# Patient Record
Sex: Female | Born: 1960 | Race: White | Hispanic: No | Smoking: Former smoker
Health system: Southern US, Community
[De-identification: ages and names within clinical notes are randomized; demographics above are authoritative.]

## PROBLEM LIST (undated history)

## (undated) DIAGNOSIS — K56609 Unspecified intestinal obstruction, unspecified as to partial versus complete obstruction: Secondary | ICD-10-CM

## (undated) DIAGNOSIS — E785 Hyperlipidemia, unspecified: Secondary | ICD-10-CM

## (undated) DIAGNOSIS — I10 Essential (primary) hypertension: Secondary | ICD-10-CM

## (undated) DIAGNOSIS — F329 Major depressive disorder, single episode, unspecified: Secondary | ICD-10-CM

## (undated) DIAGNOSIS — F32A Depression, unspecified: Secondary | ICD-10-CM

## (undated) HISTORY — DX: Depression, unspecified: F32.A

## (undated) HISTORY — PX: HERNIA REPAIR: SHX51

## (undated) HISTORY — DX: Essential (primary) hypertension: I10

## (undated) HISTORY — PX: OVARIAN CYST REMOVAL: SHX89

## (undated) HISTORY — DX: Hyperlipidemia, unspecified: E78.5

## (undated) HISTORY — DX: Major depressive disorder, single episode, unspecified: F32.9

## (undated) HISTORY — PX: OTHER SURGICAL HISTORY: SHX169

---

## 2007-11-24 ENCOUNTER — Observation Stay (HOSPITAL_COMMUNITY): Admission: EM | Admit: 2007-11-24 | Discharge: 2007-11-25 | Payer: Self-pay | Admitting: Emergency Medicine

## 2007-12-09 ENCOUNTER — Encounter: Payer: Self-pay | Admitting: Cardiology

## 2007-12-13 ENCOUNTER — Ambulatory Visit (HOSPITAL_COMMUNITY): Admission: RE | Admit: 2007-12-13 | Discharge: 2007-12-13 | Payer: Self-pay | Admitting: Cardiology

## 2008-05-02 ENCOUNTER — Ambulatory Visit (HOSPITAL_COMMUNITY): Admission: RE | Admit: 2008-05-02 | Discharge: 2008-05-02 | Payer: Self-pay | Admitting: General Surgery

## 2008-05-04 ENCOUNTER — Encounter: Admission: RE | Admit: 2008-05-04 | Discharge: 2008-05-04 | Payer: Self-pay | Admitting: General Surgery

## 2008-11-02 ENCOUNTER — Encounter: Payer: Self-pay | Admitting: Obstetrics and Gynecology

## 2008-11-02 ENCOUNTER — Other Ambulatory Visit: Admission: RE | Admit: 2008-11-02 | Discharge: 2008-11-02 | Payer: Self-pay | Admitting: Obstetrics and Gynecology

## 2008-11-02 ENCOUNTER — Ambulatory Visit: Payer: Self-pay | Admitting: Obstetrics and Gynecology

## 2009-03-16 ENCOUNTER — Observation Stay (HOSPITAL_COMMUNITY): Admission: EM | Admit: 2009-03-16 | Discharge: 2009-03-17 | Payer: Self-pay | Admitting: Emergency Medicine

## 2009-05-02 ENCOUNTER — Ambulatory Visit (HOSPITAL_COMMUNITY): Admission: RE | Admit: 2009-05-02 | Discharge: 2009-05-02 | Payer: Self-pay | Admitting: General Surgery

## 2009-05-09 ENCOUNTER — Ambulatory Visit (HOSPITAL_COMMUNITY): Admission: RE | Admit: 2009-05-09 | Discharge: 2009-05-09 | Payer: Self-pay | Admitting: General Surgery

## 2009-06-20 ENCOUNTER — Encounter: Admission: RE | Admit: 2009-06-20 | Discharge: 2009-09-18 | Payer: Self-pay | Admitting: General Surgery

## 2009-07-02 ENCOUNTER — Encounter (INDEPENDENT_AMBULATORY_CARE_PROVIDER_SITE_OTHER): Payer: Self-pay | Admitting: General Surgery

## 2009-07-02 ENCOUNTER — Inpatient Hospital Stay (HOSPITAL_COMMUNITY): Admission: RE | Admit: 2009-07-02 | Discharge: 2009-07-04 | Payer: Self-pay | Admitting: General Surgery

## 2009-07-03 ENCOUNTER — Encounter (INDEPENDENT_AMBULATORY_CARE_PROVIDER_SITE_OTHER): Payer: Self-pay | Admitting: General Surgery

## 2009-07-03 ENCOUNTER — Ambulatory Visit: Payer: Self-pay | Admitting: Vascular Surgery

## 2010-04-15 LAB — CBC
HCT: 34.6 % — ABNORMAL LOW (ref 36.0–46.0)
HCT: 36.2 % (ref 36.0–46.0)
HCT: 44 % (ref 36.0–46.0)
Hemoglobin: 11.8 g/dL — ABNORMAL LOW (ref 12.0–15.0)
Hemoglobin: 14.9 g/dL (ref 12.0–15.0)
MCHC: 33.9 g/dL (ref 30.0–36.0)
MCHC: 34.2 g/dL (ref 30.0–36.0)
MCV: 90.4 fL (ref 78.0–100.0)
MCV: 91 fL (ref 78.0–100.0)
Platelets: 222 10*3/uL (ref 150–400)
Platelets: 278 10*3/uL (ref 150–400)
RBC: 4.91 MIL/uL (ref 3.87–5.11)
RDW: 13.4 % (ref 11.5–15.5)
RDW: 13.9 % (ref 11.5–15.5)
WBC: 9.1 10*3/uL (ref 4.0–10.5)

## 2010-04-15 LAB — GLUCOSE, CAPILLARY
Glucose-Capillary: 113 mg/dL — ABNORMAL HIGH (ref 70–99)
Glucose-Capillary: 118 mg/dL — ABNORMAL HIGH (ref 70–99)
Glucose-Capillary: 124 mg/dL — ABNORMAL HIGH (ref 70–99)
Glucose-Capillary: 125 mg/dL — ABNORMAL HIGH (ref 70–99)
Glucose-Capillary: 129 mg/dL — ABNORMAL HIGH (ref 70–99)
Glucose-Capillary: 260 mg/dL — ABNORMAL HIGH (ref 70–99)
Glucose-Capillary: 97 mg/dL (ref 70–99)

## 2010-04-15 LAB — COMPREHENSIVE METABOLIC PANEL
Albumin: 4.3 g/dL (ref 3.5–5.2)
Alkaline Phosphatase: 74 U/L (ref 39–117)
BUN: 20 mg/dL (ref 6–23)
CO2: 28 mEq/L (ref 19–32)
Chloride: 101 mEq/L (ref 96–112)
Glucose, Bld: 117 mg/dL — ABNORMAL HIGH (ref 70–99)
Potassium: 5 mEq/L (ref 3.5–5.1)
Total Bilirubin: 0.7 mg/dL (ref 0.3–1.2)

## 2010-04-15 LAB — DIFFERENTIAL
Basophils Absolute: 0 10*3/uL (ref 0.0–0.1)
Basophils Relative: 0 % (ref 0–1)
Eosinophils Absolute: 0 10*3/uL (ref 0.0–0.7)
Eosinophils Relative: 0 % (ref 0–5)
Lymphocytes Relative: 8 % — ABNORMAL LOW (ref 12–46)
Lymphocytes Relative: 9 % — ABNORMAL LOW (ref 12–46)
Lymphs Abs: 0.8 10*3/uL (ref 0.7–4.0)
Monocytes Absolute: 0.5 10*3/uL (ref 0.1–1.0)
Monocytes Relative: 6 % (ref 3–12)
Monocytes Relative: 6 % (ref 3–12)
Neutro Abs: 7.6 10*3/uL (ref 1.7–7.7)
Neutro Abs: 7.6 10*3/uL (ref 1.7–7.7)
Neutro Abs: 7 10*3/uL (ref 1.7–7.7)
Neutrophils Relative %: 86 % — ABNORMAL HIGH (ref 43–77)

## 2010-04-15 LAB — HEMOGLOBIN AND HEMATOCRIT, BLOOD: HCT: 33.5 % — ABNORMAL LOW (ref 36.0–46.0)

## 2010-04-17 LAB — BASIC METABOLIC PANEL
BUN: 17 mg/dL (ref 6–23)
Calcium: 9 mg/dL (ref 8.4–10.5)
GFR calc non Af Amer: >60 mL/min (ref >60)
Glucose, Bld: 307 mg/dL — ABNORMAL HIGH (ref 70–99)
Sodium: 135 mEq/L (ref 135–145)

## 2010-04-17 LAB — LIPID PANEL
Cholesterol: 234 mg/dL — ABNORMAL HIGH (ref 0–200)
HDL: 38 mg/dL — ABNORMAL LOW (ref >39)
Triglycerides: 294 mg/dL — ABNORMAL HIGH (ref <150)

## 2010-04-17 LAB — DIFFERENTIAL
Basophils Absolute: 0 10*3/uL (ref 0.0–0.1)
Lymphocytes Relative: 13 % (ref 12–46)
Neutro Abs: 6 10*3/uL (ref 1.7–7.7)
Neutrophils Relative %: 80 % — ABNORMAL HIGH (ref 43–77)

## 2010-04-17 LAB — GLUCOSE, CAPILLARY: Glucose-Capillary: 119 mg/dL — ABNORMAL HIGH (ref 70–99)

## 2010-04-17 LAB — CK TOTAL AND CKMB (NOT AT ARMC): Relative Index: 2.2 (ref 0.0–2.5)

## 2010-04-17 LAB — POCT CARDIAC MARKERS
CKMB, poc: 1.8 ng/mL (ref 1.0–8.0)
Troponin i, poc: <0.05 ng/mL (ref 0.00–0.09)

## 2010-04-17 LAB — TROPONIN I
Troponin I: 0.01 ng/mL (ref 0.00–0.06)
Troponin I: 0.01 ng/mL (ref 0.00–0.06)

## 2010-04-17 LAB — CBC
Platelets: 241 10*3/uL (ref 150–400)
RDW: 13.6 % (ref 11.5–15.5)

## 2010-06-11 NOTE — Discharge Summary (Signed)
NAMEJONNA, Ashlee Thomas             ACCOUNT NO.:  [id number]   MEDICAL RECORD NO.:  [id number]          PATIENT TYPE:  INP   LOCATION:  3710                         FACILITY:  [facility]   PHYSICIAN:  Peggye Pitt, M.D. DATE OF BIRTH:  02-19-1960   DATE OF ADMISSION:  11/24/2007  DATE OF DISCHARGE:  11/25/2007                               DISCHARGE SUMMARY   DISCHARGE DIAGNOSES:  1. Chest pain ruled out for acute myocardial infarction.  2. Hypertension.  3. Type 2 diabetes.  4. Hyperlipidemia.  5. Obesity.  6. Depression.   DISCHARGE MEDICATIONS:  1. Aspirin 81 mg p.o. daily.  2. Byetta 10 mcg subcu b.i.d.  3. Prozac 40 mg daily.  4. Glyburide 10 mg b.i.d.  5. Hydrochlorothiazide 25 mg daily.  6. Lisinopril 20 mg daily.  7. Metformin 1000 mg p.o. b.i.d.   DISPOSITION AND FOLLOWUP:  The patient is discharged home in stable  condition.  Her chest pain has now resolved.  She is to follow up with  Dr. Patty Sermons with [facility] on December 01, 2007 at 2:00  p.m. at which time she should be evaluated for a possible stress test.   CONSULTATIONS THIS HOSPITALIZATION:  None.   IMAGES PERFORMED THIS HOSPITALIZATION:  Chest x-ray on November 24, 2007  consistent with no acute disease.  She had an EKG that showed normal  sinus rhythm at a rate of 80 with left axis deviation and no acute ST-T  wave changes.   HISTORY AND PHYSICAL EXAM:  For full details, please refer to history  and physical dictated by Dr. Flonnie Overman on November 24, 2007, but in brief,  Ashlee Thomas is a 50 year old Caucasian woman who [facility] with chest pain that lasted about 20 minutes that  was located over a left precordial area, pressure like and was not  associated with any other symptoms.  There was no shortness of breath,  movements, fevers, or cough.  She was given sublingual nitro which  completely resolved her chest pain.  For this reason, we are called for   admission.   HOSPITAL COURSE:  1. Chest pain.  The patient ruled out for acute myocardial infarction      with three sets of negative cardiac enzymes as well as an EKG that      showed no acute changes..  She did not have any arrhythmias on      telemetry.  Given the fact that she does have some risk factors for      coronary artery disease,  I have set her up to see Dr. Patty Sermons as      an outpatient on December 01, 2007 for consultation.  I believe at      that time some serous consideration should be given to performing      some form of a stress test.  2. For her hypertension, she has maintained good blood pressure in the      hospital.  We will continue her home medications.  3. For her type 2 diabetes with good CBG control, continue her home  medications.  4. For her hyperlipidemia, she had a good fasting lipid panel.   VITAL SIGNS UPON DISCHARGE:  Blood pressure 107/73, heart rate 81,  respirations 19, O2 sats 99% on room air with a temperature of 98.6.   Total cholesterol of 157, triglycerides of 207, HDL of 32 and LDL of 84,  hemoglobin A1c of 8.8, a TSH of 1.116 and negative cardiac enzymes x3.      Peggye Pitt, M.D.  Electronically Signed     EH/MEDQ  D:  11/25/2007  T:  11/26/2007  Job:  [id number]   cc:   Ashlee Thomas, M.D.  Ashlee Thomas, M.D.

## 2010-06-11 NOTE — H&P (Signed)
Ashlee Thomas, Ashlee Thomas             ACCOUNT NO.:  [id number]   MEDICAL RECORD NO.:  [id number]          PATIENT TYPE:  INP   LOCATION:  1827                         FACILITY:  [facility]   PHYSICIAN:  Lucita Ferrara, MD         DATE OF BIRTH:  May 10, 1960   DATE OF ADMISSION:  11/24/2007  DATE OF DISCHARGE:                              HISTORY & PHYSICAL   PRIMARY CARE DOCTORS:  Dr. Herb Grays.   CHIEF COMPLAINT:  The patient is a 50 year old Caucasian female who  presents to the [facility] with chest pain which started  earlier in the day, lasted for about 20 minutes, located left  ventricular precordial area, pressure-like in intensity, not associated  with food, movement, fevers, cough,.  The patient was given sublingual  nitroglycerin via EMS which resolved the chest pain.  The patient denies  abdominal pain, nausea, vomiting.  There is no pleuritis, cough, fevers,  chills.  Risk factors include:  Hypertension, diabetes,  hypercholesteremia.  There is no family history of premature coronary  artery disease.   PAST MEDICAL HISTORY:  Significant for:  1. Hypertension.  2. Diabetes.  3. Depression.  4. Hypercholesteremia.   ALLERGIES:  No known drug allergies.   SOCIAL HISTORY:  She denies drugs, alcohol or tobacco.   SURGERIES:  No past surgeries.   MEDICATIONS NOT VERIFIED YET:  Include Byetta, glyburide, metformin,  Prozac, Vytorin.   REVIEW OF SYSTEMS:  As per HPI, otherwise negative.   PHYSICAL EXAMINATION:  Generally speaking, the patient is in no acute  distress.  Blood pressure is 120/90.  Pulse 84.  Respiration is 20.  Temperature  98.7.  HEENT:  Normocephalic, atraumatic.  Sclerae is anicteric.  NECK:  Supple.  No JVD, no carotid bruits.  PERRLA, extraocular muscles  intact.  CARDIOVASCULAR:  S1, S2, regular rate and rhythm.  No murmurs, rubs,  clicks.  LUNGS:  Clear to auscultation bilaterally.  No rhonchi, rales or  wheezes.  ABDOMEN:  Obese, soft,  nontender, nondistended.  Positive bowel sounds.  EXTREMITIES:  No clubbing, cyanosis or edema.  NEUROLOGIC:  The patient is alert and oriented x3.  Cranial nerves II  through XII grossly intact.   LABORATORY DATA:  Cardiac enzymes 3 sets, negative for troponins.  Basic  metabolic panel normal with exception of elevated glucose of 225.  CBC  within normal limits.   ASSESSMENT/PLAN:  Patient is a 50 year old with chest pain and moderate  risk factors including what seems to be uncontrolled diabetes,  controlled hypertension, there is no premature coronary artery disease.  Patient is a nonsmoker.  She has never had chest pain such as this  before.  She never had stress test or coronary evaluation before.  Her  EKG shows left anterior fascicular block, left atrial hypertrophy, left  axis deviation and there is no known prior EKGs.   DISCUSSION/PLAN:  We will go ahead and admit the patient for chest pain,  rule out ACS protocol.  Cardiac enzymes x3 every 8 hours.  EKG shows  nonspecific changes.  No acute changes.  I will  repeat her EKG in the  morning.  Will get hemoglobin A1c and monitor her diabetic control.  We  will continue but will hold her oral medications and start her on a  sliding scale insulin.  We will check a fasting lipid profile in the  morning and TSH in the morning.  We will proceed with a stress test in  the morning if her enzymes are negative.  This obviously could be  arranged as outpatient or inpatient depending on her risk  stratification.  The rest of plans depend on her progress, consult and  recommendations.      Lucita Ferrara, MD  Electronically Signed     RR/MEDQ  D:  11/24/2007  T:  11/24/2007  Job:  [id number]

## 2010-06-11 NOTE — H&P (Signed)
[name], WEDDINGTON NO.:  [id number]   MEDICAL RECORD NO.:  [id number]           PATIENT TYPE:   LOCATION:                                 FACILITY:   PHYSICIAN:  Peter M. Swaziland, M.D.  DATE OF BIRTH:  08-May-1960   DATE OF ADMISSION:  12/13/2007  DATE OF DISCHARGE:                              HISTORY & PHYSICAL   HISTORY OF PRESENT ILLNESS:  Mr. Ashlee Thomas is a pleasant 50 year old white  female.  She was hospitalized at the end of October at [facility] after experiencing episode of pallor and chest pain with  radiation down her left arm.  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  She  states that day she felt unwell and a vague sense, and then became  worse.  She had no nausea or vomiting.  She was admitted to the  hospitalist service and ruled out for myocardial infarction.  Subsequently, referred for outpatient stress Cardiolite study.  On  December 09, 2007, she underwent adenosine Cardiolite study, and this  demonstrated evidence of moderate anterior wall ischemia.  Ejection  fraction was normal at 55%.  Based on these findings, it was recommended  she undergo cardiac catheterization.  The patient does have multiple  cardiac risk factors including history of diabetes, hypertension,  obesity, and hypercholesterolemia.   PAST MEDICAL HISTORY:  1. Diabetes mellitus, type 2.  2. Hypertension.  3. Hypercholesterolemia.  4. Depression.  5. Previous tonsillectomy.  6. Previous ovarian cyst removal.   She has no known allergies.   Current medications include:  1. Aspirin 81 mg per day.  2. Byetta 10 mcg b.i.d.  3. Prozac 40 mg per day.  4. Glyburide 10 mg b.i.d.  5. Metformin 1000 mg b.i.d.  6. Vytorin 10/80 mg daily.  7. Lisinopril/hydrochlorothiazide 20/25 mg per day.   SOCIAL HISTORY:  The patient is a second grade Engineer, site.  She is  divorced.  She has 5 children, ages  11-25.  She quit smoking 25 years  ago.  She denies alcohol use.   FAMILY HISTORY:  Father is age 50, in good health.  Mother is age 69,  has diabetes.  One brother age 19, is in good health.   REVIEW OF SYSTEMS:  There is no known history of peptic ulcer disease or  cholelithiasis.  She has a large left inguinal hernia, which has been  unrepaired due to her large body mass.  She has considered gastric  banding surgery in the past.  She states she recently fractured her  right foot and is wearing a walking boot.  All other systems were  reviewed in detail, are negative.   PHYSICAL EXAMINATION:  GENERAL:  The patient is an obese white female in  no apparent distress.  VITAL SIGNS:  Weight is 286, blood pressure is 110/80, pulse 96 and  regular, and respirations are normal.  HEENT:  She is normocephalic and atraumatic.  Pupils are equal, round,  and reactive.  Oropharynx is clear.  NECK:  Supple without JVD, adenopathy, thyromegaly, or bruits.  LUNGS:  Clear to auscultation and percussion.  CARDIAC:  Regular rate and rhythm without gallop, murmur, rub, or click.  ABDOMEN:  Obese, soft, nontender and no mass or bruits.  EXTREMITIES:  Without edema or cyanosis.  Pedal pulses are palpable.  She does have a left inguinal hernia in the left lower quadrant.  SKIN:  Warm and dry.  She is wearing a walking boot on the right foot.  NEUROLOGIC:  Alert and oriented x4.  Mood is appropriate.   LABORATORY DATA:  ECG shows normal sinus rhythm with left axis  deviation.  Chest x-ray showed no active disease.  Other chemistries are  pending.   IMPRESSION:  1. Recent onset of chest pain in patient with multiple cardiac risk      factors.  Abnormal adenosine Cardiolite study showing evidence of      anterior wall ischemia.  2. Diabetes mellitus, type 2.  3. Hypertension.  4. Hypercholesterolemia.  5. Morbid obesity.   PLAN:  We will proceed with diagnostic cardiac catheterization with   potential intervention if indicated.  Procedure and risks were described  in detail to the patient.           ______________________________  Peter M. Swaziland, M.D.     PMJ/MEDQ  D:  12/10/2007  T:  12/11/2007  Job:  [id number]   cc:   Ashlee Thomas, M.D.  Ashlee Thomas, M.D.

## 2010-10-29 LAB — GLUCOSE, CAPILLARY
Glucose-Capillary: 128 — ABNORMAL HIGH
Glucose-Capillary: 170 — ABNORMAL HIGH
Glucose-Capillary: 248 — ABNORMAL HIGH
Glucose-Capillary: 385 — ABNORMAL HIGH

## 2010-10-29 LAB — LIPID PANEL
LDL Cholesterol: 84
Total CHOL/HDL Ratio: 4.9
VLDL: 41 — ABNORMAL HIGH

## 2010-10-29 LAB — CK TOTAL AND CKMB (NOT AT ARMC): Total CK: 101

## 2010-10-29 LAB — POCT I-STAT, CHEM 8
Calcium, Ion: 1.18
Creatinine, Ser: 0.7
Glucose, Bld: 225 — ABNORMAL HIGH
Hemoglobin: 13.9
TCO2: 26

## 2010-10-29 LAB — CBC
HCT: 39.9
MCV: 88.8
RBC: 4.49
WBC: 7.3

## 2010-10-29 LAB — DIFFERENTIAL
Eosinophils Absolute: 0.1
Eosinophils Relative: 2
Lymphs Abs: 0.9
Monocytes Absolute: 0.3
Monocytes Relative: 5

## 2010-10-29 LAB — POCT CARDIAC MARKERS
Myoglobin, poc: 67.9
Troponin i, poc: <0.05
Troponin i, poc: <0.05
Troponin i, poc: <0.05

## 2010-10-29 LAB — CARDIAC PANEL(CRET KIN+CKTOT+MB+TROPI)
Total CK: 81
Total CK: 93

## 2010-10-29 LAB — HEMOGLOBIN A1C: Mean Plasma Glucose: 206

## 2010-12-03 ENCOUNTER — Telehealth (INDEPENDENT_AMBULATORY_CARE_PROVIDER_SITE_OTHER): Payer: Self-pay | Admitting: General Surgery

## 2010-12-03 NOTE — Telephone Encounter (Signed)
12/03/10 mailed recall notice to patient for bariatric surgery follow-up. Adv pt to call [facility] to schedule an appt. cef °

## 2011-05-08 ENCOUNTER — Encounter: Payer: Self-pay | Admitting: *Deleted

## 2011-05-08 DIAGNOSIS — F329 Major depressive disorder, single episode, unspecified: Secondary | ICD-10-CM | POA: Insufficient documentation

## 2011-05-08 DIAGNOSIS — F32A Depression, unspecified: Secondary | ICD-10-CM | POA: Insufficient documentation

## 2016-01-31 ENCOUNTER — Encounter (HOSPITAL_COMMUNITY): Payer: Self-pay

## 2018-08-03 ENCOUNTER — Emergency Department (HOSPITAL_COMMUNITY): Payer: Managed Care, Other (non HMO)

## 2018-08-03 ENCOUNTER — Inpatient Hospital Stay (HOSPITAL_COMMUNITY)
Admission: EM | Admit: 2018-08-03 | Discharge: 2018-08-06 | DRG: 872 | Disposition: A | Discharge status: Home / Self Care | Payer: Managed Care, Other (non HMO) | Attending: Internal Medicine | Admitting: Internal Medicine

## 2018-08-03 ENCOUNTER — Encounter (HOSPITAL_COMMUNITY): Payer: Self-pay

## 2018-08-03 ENCOUNTER — Other Ambulatory Visit: Payer: Self-pay

## 2018-08-03 DIAGNOSIS — F329 Major depressive disorder, single episode, unspecified: Secondary | ICD-10-CM | POA: Diagnosis present

## 2018-08-03 DIAGNOSIS — A419 Sepsis, unspecified organism: Secondary | ICD-10-CM | POA: Diagnosis present

## 2018-08-03 DIAGNOSIS — Z8719 Personal history of other diseases of the digestive system: Secondary | ICD-10-CM

## 2018-08-03 DIAGNOSIS — Z87891 Personal history of nicotine dependence: Secondary | ICD-10-CM

## 2018-08-03 DIAGNOSIS — K56609 Unspecified intestinal obstruction, unspecified as to partial versus complete obstruction: Secondary | ICD-10-CM | POA: Diagnosis present

## 2018-08-03 DIAGNOSIS — E1165 Type 2 diabetes mellitus with hyperglycemia: Secondary | ICD-10-CM | POA: Diagnosis present

## 2018-08-03 DIAGNOSIS — I10 Essential (primary) hypertension: Secondary | ICD-10-CM | POA: Diagnosis present

## 2018-08-03 DIAGNOSIS — Z9049 Acquired absence of other specified parts of digestive tract: Secondary | ICD-10-CM | POA: Diagnosis not present

## 2018-08-03 DIAGNOSIS — Z79899 Other long term (current) drug therapy: Secondary | ICD-10-CM

## 2018-08-03 DIAGNOSIS — Z7982 Long term (current) use of aspirin: Secondary | ICD-10-CM

## 2018-08-03 DIAGNOSIS — E861 Hypovolemia: Secondary | ICD-10-CM | POA: Diagnosis present

## 2018-08-03 DIAGNOSIS — K56600 Partial intestinal obstruction, unspecified as to cause: Secondary | ICD-10-CM | POA: Diagnosis present

## 2018-08-03 DIAGNOSIS — Z20828 Contact with and (suspected) exposure to other viral communicable diseases: Secondary | ICD-10-CM | POA: Diagnosis present

## 2018-08-03 DIAGNOSIS — R1013 Epigastric pain: Secondary | ICD-10-CM | POA: Diagnosis present

## 2018-08-03 DIAGNOSIS — E785 Hyperlipidemia, unspecified: Secondary | ICD-10-CM | POA: Diagnosis present

## 2018-08-03 DIAGNOSIS — Z7984 Long term (current) use of oral hypoglycemic drugs: Secondary | ICD-10-CM | POA: Diagnosis not present

## 2018-08-03 DIAGNOSIS — Z5329 Procedure and treatment not carried out because of patient's decision for other reasons: Secondary | ICD-10-CM | POA: Diagnosis present

## 2018-08-03 DIAGNOSIS — Z0189 Encounter for other specified special examinations: Secondary | ICD-10-CM

## 2018-08-03 DIAGNOSIS — E119 Type 2 diabetes mellitus without complications: Secondary | ICD-10-CM | POA: Diagnosis not present

## 2018-08-03 DIAGNOSIS — Z9884 Bariatric surgery status: Secondary | ICD-10-CM | POA: Diagnosis not present

## 2018-08-03 HISTORY — DX: Unspecified intestinal obstruction, unspecified as to partial versus complete obstruction: K56.609

## 2018-08-03 LAB — CBC
HCT: 46.1 % — ABNORMAL HIGH (ref 36.0–46.0)
Hemoglobin: 15.2 g/dL — ABNORMAL HIGH (ref 12.0–15.0)
MCH: 30.2 pg (ref 26.0–34.0)
MCHC: 33 g/dL (ref 30.0–36.0)
MCV: 91.7 fL (ref 80.0–100.0)
Platelets: 297 10*3/uL (ref 150–400)
RBC: 5.03 MIL/uL (ref 3.87–5.11)
RDW: 12.5 % (ref 11.5–15.5)
WBC: 18.9 10*3/uL — ABNORMAL HIGH (ref 4.0–10.5)
nRBC: 0 % (ref 0.0–0.2)

## 2018-08-03 LAB — COMPREHENSIVE METABOLIC PANEL
ALT: 23 U/L (ref 0–44)
AST: 26 U/L (ref 15–41)
Albumin: 4.6 g/dL (ref 3.5–5.0)
Alkaline Phosphatase: 119 U/L (ref 38–126)
Anion gap: 18 — ABNORMAL HIGH (ref 5–15)
BUN: 22 mg/dL — ABNORMAL HIGH (ref 6–20)
CO2: 17 mmol/L — ABNORMAL LOW (ref 22–32)
Calcium: 9.8 mg/dL (ref 8.9–10.3)
Chloride: 101 mmol/L (ref 98–111)
Creatinine, Ser: 0.9 mg/dL (ref 0.44–1.00)
GFR calc Af Amer: >60 mL/min (ref >60)
GFR calc non Af Amer: >60 mL/min (ref >60)
Glucose, Bld: 338 mg/dL — ABNORMAL HIGH (ref 70–99)
Potassium: 3.5 mmol/L (ref 3.5–5.1)
Sodium: 136 mmol/L (ref 135–145)
Total Bilirubin: 0.2 mg/dL — ABNORMAL LOW (ref 0.3–1.2)
Total Protein: 7.8 g/dL (ref 6.5–8.1)

## 2018-08-03 LAB — SARS CORONAVIRUS 2 BY RT PCR (HOSPITAL ORDER, PERFORMED IN ~~LOC~~ HOSPITAL LAB): SARS Coronavirus 2: NEGATIVE

## 2018-08-03 LAB — CBG MONITORING, ED: Glucose-Capillary: 274 mg/dL — ABNORMAL HIGH (ref 70–99)

## 2018-08-03 LAB — LACTIC ACID, PLASMA
Lactic Acid, Venous: 4.4 mmol/L (ref 0.5–1.9)
Lactic Acid, Venous: 2.7 mmol/L (ref 0.5–1.9)

## 2018-08-03 LAB — LIPASE, BLOOD: Lipase: 62 U/L — ABNORMAL HIGH (ref 11–51)

## 2018-08-03 MED ORDER — SODIUM CHLORIDE 0.9 % IV BOLUS
1000.0000 mL | Freq: Once | INTRAVENOUS | Status: AC
  Administered 2018-08-03: 1000 mL via INTRAVENOUS

## 2018-08-03 MED ORDER — SODIUM CHLORIDE (PF) 0.9 % IJ SOLN
INTRAMUSCULAR | Status: AC
  Administered 2018-08-03: 21:00:00
  Filled 2018-08-03: qty 50

## 2018-08-03 MED ORDER — ONDANSETRON 4 MG PO TBDP
4.0000 mg | ORAL_TABLET | Freq: Once | ORAL | Status: AC | PRN
  Administered 2018-08-03: 4 mg via ORAL
  Filled 2018-08-03: qty 1

## 2018-08-03 MED ORDER — IOHEXOL 300 MG/ML  SOLN
100.0000 mL | Freq: Once | INTRAMUSCULAR | Status: AC | PRN
  Administered 2018-08-03: 100 mL via INTRAVENOUS

## 2018-08-03 MED ORDER — DIATRIZOATE MEGLUMINE & SODIUM 66-10 % PO SOLN
90.0000 mL | Freq: Once | ORAL | Status: DC
  Filled 2018-08-03: qty 90

## 2018-08-03 MED ORDER — MORPHINE SULFATE (PF) 4 MG/ML IV SOLN
4.0000 mg | Freq: Once | INTRAVENOUS | Status: AC
  Administered 2018-08-03: 4 mg via INTRAVENOUS
  Filled 2018-08-03: qty 1

## 2018-08-03 MED ORDER — MORPHINE SULFATE (PF) 2 MG/ML IV SOLN
2.0000 mg | INTRAVENOUS | Status: DC | PRN
  Administered 2018-08-04: 2 mg via INTRAVENOUS
  Filled 2018-08-03: qty 1

## 2018-08-03 MED ORDER — SODIUM CHLORIDE 0.9 % IV SOLN
INTRAVENOUS | Status: DC
  Administered 2018-08-03 – 2018-08-05 (×2): via INTRAVENOUS

## 2018-08-03 MED ORDER — METRONIDAZOLE IN NACL 5-0.79 MG/ML-% IV SOLN
500.0000 mg | Freq: Once | INTRAVENOUS | Status: AC
  Administered 2018-08-03: 500 mg via INTRAVENOUS
  Filled 2018-08-03: qty 100

## 2018-08-03 MED ORDER — SODIUM CHLORIDE 0.9% FLUSH: 3.0000 mL | Freq: Once | INTRAVENOUS | Status: DC

## 2018-08-03 MED ORDER — ONDANSETRON HCL 4 MG/2ML IJ SOLN
4.0000 mg | Freq: Once | INTRAMUSCULAR | Status: AC
  Administered 2018-08-03: 4 mg via INTRAVENOUS
  Filled 2018-08-03: qty 2

## 2018-08-03 MED ORDER — HYDRALAZINE HCL 20 MG/ML IJ SOLN: 5.0000 mg | INTRAMUSCULAR | Status: DC | PRN

## 2018-08-03 MED ORDER — PIPERACILLIN-TAZOBACTAM 3.375 G IVPB
3.3750 g | Freq: Three times a day (TID) | INTRAVENOUS | Status: DC
  Administered 2018-08-04 – 2018-08-05 (×4): 3.375 g via INTRAVENOUS
  Filled 2018-08-03 (×4): qty 50

## 2018-08-03 MED ORDER — SODIUM CHLORIDE 0.9 % IV SOLN
2.0000 g | Freq: Once | INTRAVENOUS | Status: AC
  Administered 2018-08-03: 2 g via INTRAVENOUS
  Filled 2018-08-03: qty 20

## 2018-08-03 MED ORDER — ONDANSETRON HCL 4 MG/2ML IJ SOLN
4.0000 mg | Freq: Three times a day (TID) | INTRAMUSCULAR | Status: DC | PRN
  Filled 2018-08-03: qty 2

## 2018-08-03 NOTE — ED Triage Notes (Signed)
Patient reports a history of bowel obstruction in May.  Patient reports that she began having mid abdominal pain with frequent vomiting this AM  Patient states she traveled to [place], [place] and [place] last week.

## 2018-08-03 NOTE — ED Notes (Signed)
Patient transported to CT 

## 2018-08-03 NOTE — ED Notes (Addendum)
Pt wheeled from triage to room, extremely diaphoretic.  Rates abdominal pain 10/10, described as cramping behind belly button. MD Melina Copa made aware.

## 2018-08-03 NOTE — ED Notes (Signed)
ED TO INPATIENT HANDOFF REPORT  Name/Age/Gender Ashlee Thomas 58 y.o. female  Code Status   Home/SNF/Other Home  Chief Complaint Abdominal Pain; Emesis  Level of Care/Admitting Diagnosis ED Disposition    ED Disposition Condition Comment   Admit  [facility] [[id number]]  Level of Care: Med-Surg [16]  Covid Evaluation: N/A  Diagnosis: SBO (small bowel obstruction) [facility](HCC) [[id number]]) [[id number]]  Admitting Physician: Lorretta HarpNIU, XILIN [[id number]]  Attending Physician: Lorretta HarpNIU, XILIN [phone][[id number]]  Estimated length of stay: past midnight tomorrow  Certification:: I certify this patient will need inpatient services for at least 2 midnights  PT Class (Do Not Modify): Inpatient [101]  PT Acc Code (Do Not Modify): Private [1]       Medical History Past Medical History:  Diagnosis Date  . Bowel obstruction (HCC)   . Depression   . Diabetes mellitus   . Hyperlipidemia   . Hypertension     Allergies No Known Allergies  IV Location/Drains/Wounds Patient Lines/Drains/Airways Status   Active Line/Drains/Airways    Name:   Placement date:   Placement time:   Site:   Days:   Peripheral IV 08/03/18 Right Forearm   08/03/18    1838    Forearm   less than 1   Peripheral IV 08/03/18 Left Hand   08/03/18    1838    Hand   less than 1          Labs/Imaging Results for orders placed or performed during the hospital encounter of 08/03/18 (from the past 48 hour(s))  CBG monitoring, ED     Status: Abnormal   Collection Time: 08/03/18  6:10 PM  Result Value Ref Range   Glucose-Capillary 274 (H) 70 - 99 mg/dL  Lipase, blood     Status: Abnormal   Collection Time: 08/03/18  6:40 PM  Result Value Ref Range   Lipase 62 (H) 11 - 51 U/L    Comment: Performed at [facility], [address] [place], [place] [address]  Comprehensive metabolic panel     Status: Abnormal   Collection Time: 08/03/18  6:40 PM  Result Value Ref Range   Sodium 136 135 - 145 mmol/L    Potassium 3.5 3.5 - 5.1 mmol/L   Chloride 101 98 - 111 mmol/L   CO2 17 (L) 22 - 32 mmol/L   Glucose, Bld 338 (H) 70 - 99 mg/dL   BUN 22 (H) 6 - 20 mg/dL   Creatinine, Ser 9.620.90 0.44 - 1.00 mg/dL   Calcium 9.8 8.9 - 95.210.3 mg/dL   Total Protein 7.8 6.5 - 8.1 g/dL   Albumin 4.6 3.5 - 5.0 g/dL   AST 26 15 - 41 U/L   ALT 23 0 - 44 U/L   Alkaline Phosphatase 119 38 - 126 U/L   Total Bilirubin 0.2 (L) 0.3 - 1.2 mg/dL   GFR calc non Af Amer >60 >60 mL/min   GFR calc Af Amer >60 >60 mL/min   Anion gap 18 (H) 5 - 15    Comment: Performed at [facility], [address] [place], [place] [address]  CBC     Status: Abnormal   Collection Time: 08/03/18  6:40 PM  Result Value Ref Range   WBC 18.9 (H) 4.0 - 10.5 K/uL   RBC 5.03 3.87 - 5.11 MIL/uL   Hemoglobin 15.2 (H) 12.0 - 15.0 g/dL   HCT 44.046.1 (H) 10.236.0 - 72.546.0 %   MCV 91.7 80.0 - 100.0 fL  MCH 30.2 26.0 - 34.0 pg   MCHC 33.0 30.0 - 36.0 g/dL   RDW 12.5 11.5 - 15.5 %   Platelets 297 150 - 400 K/uL   nRBC 0.0 0.0 - 0.2 %    Comment: Performed at [facility], [place] [address] [place], [place] [address]  Lactic acid, plasma     Status: Abnormal   Collection Time: 08/03/18  6:40 PM  Result Value Ref Range   Lactic Acid, Venous 4.4 (HH) 0.5 - 1.9 mmol/L    Comment: CRITICAL RESULT CALLED TO, READ BACK BY AND VERIFIED WITH: C.FRANKLIN AT 1952 ON 08/03/18 BY N.THOMPSON Performed at [facility], [place] [address], [place], [place] [address]   SARS Coronavirus 2 (CEPHEID - Performed in [place] hospital lab), Hosp Order     Status: None   Collection Time: 08/03/18  7:32 PM   Specimen: Nasopharyngeal Swab  Result Value Ref Range   SARS Coronavirus 2 NEGATIVE NEGATIVE    Comment: (NOTE) If result is NEGATIVE SARS-CoV-2 target nucleic acids are NOT DETECTED. The SARS-CoV-2 RNA is generally detectable in upper and lower  respiratory specimens during the acute phase of infection. The lowest   concentration of SARS-CoV-2 viral copies this assay can detect is 250  copies / mL. A negative result does not preclude SARS-CoV-2 infection  and should not be used as the sole basis for treatment or other  patient management decisions.  A negative result may occur with  improper specimen collection / handling, submission of specimen other  than nasopharyngeal swab, presence of viral mutation(s) within the  areas targeted by this assay, and inadequate number of viral copies  (<250 copies / mL). A negative result must be combined with clinical  observations, patient history, and epidemiological information. If result is POSITIVE SARS-CoV-2 target nucleic acids are DETECTED. The SARS-CoV-2 RNA is generally detectable in upper and lower  respiratory specimens dur ing the acute phase of infection.  Positive  results are indicative of active infection with SARS-CoV-2.  Clinical  correlation with patient history and other diagnostic information is  necessary to determine patient infection status.  Positive results do  not rule out bacterial infection or co-infection with other viruses. If result is PRESUMPTIVE POSTIVE SARS-CoV-2 nucleic acids MAY BE PRESENT.   A presumptive positive result was obtained on the submitted specimen  and confirmed on repeat testing.  While 2019 novel coronavirus  (SARS-CoV-2) nucleic acids may be present in the submitted sample  additional confirmatory testing may be necessary for epidemiological  and / or clinical management purposes  to differentiate between  SARS-CoV-2 and other Sarbecovirus currently known to infect humans.  If clinically indicated additional testing with an alternate test  methodology [phone]) is advised. The SARS-CoV-2 RNA is generally  detectable in upper and lower respiratory sp ecimens during the acute  phase of infection. The expected result is Negative. Fact Sheet for Patients:  StrictlyIdeas.no Fact Sheet  for Healthcare Providers: BankingDealers.co.za This test is not yet approved or cleared by the [place] FDA and has been authorized for detection and/or diagnosis of SARS-CoV-2 by FDA under an Emergency Use Authorization (EUA).  This EUA will remain in effect (meaning this test can be used) for the duration of the COVID-19 declaration under Section 564(b)(1) of the Act, 21 U.S.C. section 360bbb-3(b)(1), unless the authorization is terminated or revoked sooner. Performed at [facility], [place] [address] [place], [place] [address]   Lactic acid, plasma  Status: Abnormal   Collection Time: 08/03/18  8:40 PM  Result Value Ref Range   Lactic Acid, Venous 2.7 (HH) 0.5 - 1.9 mmol/L    Comment: CRITICAL RESULT CALLED TO, READ BACK BY AND VERIFIED WITH: J.OXENDINE AT 2213 ON 08/03/18 BY N.THOMPSON Performed at [facility], [address] [place], [place] [address]    Ct Abdomen Pelvis W Contrast  Result Date: 08/03/2018 CLINICAL DATA:  Mid abdominal pain with frequent voiding beginning this morning. History of a previous bowel obstruction. EXAM: CT ABDOMEN AND PELVIS WITH CONTRAST TECHNIQUE: Multidetector CT imaging of the abdomen and pelvis was performed using the standard protocol following bolus administration of intravenous contrast. CONTRAST:  100mL OMNIPAQUE IOHEXOL 300 MG/ML  SOLN COMPARISON:  None. FINDINGS: Lower chest: Clear lung bases.  Heart normal size. Hepatobiliary: Small calcification over the dome of the right lobe. Liver normal in size. No mass. Status post cholecystectomy. Mild prominence of the intrahepatic biliary tree. Common bile duct is mildly prominent measuring 7 mm, presumed chronic. Pancreas: Unremarkable. No pancreatic ductal dilatation or surrounding inflammatory changes. Spleen: Normal in size without focal abnormality. Adrenals/Urinary Tract: Adrenal glands are unremarkable. Kidneys are normal, without  renal calculi, focal lesion, or hydronephrosis. Bladder is unremarkable. 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 The stomach is mild-to-moderately distended. There is significant small bowel dilation, with small bowel having a diameter of 5.5 cm in the left upper quadrant. The distal small bowel is decompressed. The apparent transition is in the right upper pelvis. There is no small bowel wall thickening or adjacent inflammation. The colon is mostly decompressed. Appendix not discretely seen, but there is no evidence of appendicitis. No colonic wall thickening or inflammation. Vascular/Lymphatic: Normal caliber aorta. Minor atherosclerotic changes. No other vascular abnormality. No enlarged lymph nodes. Reproductive: Uterus and bilateral adnexa are unremarkable. Other: No abdominal wall hernia.  No ascites. Musculoskeletal: Chronic bilateral pars defects at L5-S1 with a grade 1 anterolisthesis. No acute fractures. No osteoblastic or osteolytic lesions. IMPRESSION: 1. Partial small bowel obstruction, moderate in severity, with the transition point in the right upper pelvis, likely due to adhesions. 2. No bowel wall thickening or adjacent inflammation. No evidence of ischemia. 3. No other acute abnormality. 4. 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 5. Minor aortic atherosclerosis. Electronically Signed   By: Amie Portlandavid  Ormond M.D.   On: 08/03/2018 20:55    Pending Labs Unresulted Labs (From admission, onward)    Start     Ordered   08/03/18 2259  Protime-INR  Once,   STAT     08/03/18 2259   08/03/18 2259  APTT  Once,   STAT     08/03/18 2259   08/03/18 2259  Type and screen [facility]  Once,   STAT    Comments: [facility]    08/03/18 2259   08/03/18 1808  Urinalysis, Routine w reflex microscopic  ONCE - STAT,   STAT     08/03/18 1807   Signed and Held  HIV  antibody (Routine Testing)  Once,   R     Signed and Held   Signed and Held  Basic metabolic panel  Tomorrow morning,   R     Signed and Held   Signed and Held  CBC  Tomorrow morning,   R     Signed and Held   Signed and Held  Culture, blood (x 2)  BLOOD CULTURE X 2,   STAT    Comments: INITIATE ANTIBIOTICS WITHIN 1 HOUR AFTER BLOOD CULTURES DRAWN.  If unable to obtain blood cultures, call MD immediately regarding antibiotic instructions.    Signed and Held   Signed and Held  Lactic acid, plasma  STAT Now then every 3 hours,   STAT     Signed and Held   Signed and Held  Procalcitonin  ONCE - STAT,   R     Signed and Held          Vitals/Pain Today's Vitals   08/03/18 2200 08/03/18 2230 08/03/18 2300 08/03/18 2309  BP: (!) 149/96 (!) 148/94 (!) 154/105   Pulse: 96 (!) 102 (!) 105   Resp: 20 19 19    Temp:      TempSrc:      SpO2: 98% 92% 96%   Weight:      Height:      PainSc:    8     Isolation Precautions No active isolations  Medications Medications  diatrizoate meglumine-sodium (GASTROGRAFIN) 66-10 % solution 90 mL (has no administration in time range)  morphine 2 MG/ML injection 2 mg (has no administration in time range)  ondansetron (ZOFRAN) injection 4 mg (has no administration in time range)  0.9 %  sodium chloride infusion (has no administration in time range)  hydrALAZINE (APRESOLINE) injection 5 mg (has no administration in time range)  ondansetron (ZOFRAN-ODT) disintegrating tablet 4 mg (4 mg Oral Given 08/03/18 1815)  morphine 4 MG/ML injection 4 mg (4 mg Intravenous Given 08/03/18 1857)  sodium chloride 0.9 % bolus 1,000 mL (0 mLs Intravenous Stopped 08/03/18 2109)  ondansetron (ZOFRAN) injection 4 mg (4 mg Intravenous Given 08/03/18 1930)  morphine 4 MG/ML injection 4 mg (4 mg Intravenous Given 08/03/18 1930)  cefTRIAXone (ROCEPHIN) 2 g in sodium chloride 0.9 % 100 mL IVPB (0 g Intravenous Stopped 08/03/18 2109)  metroNIDAZOLE (FLAGYL) IVPB 500 mg (0 mg Intravenous  Stopped 08/03/18 2213)  sodium chloride 0.9 % bolus 1,000 mL (1,000 mLs Intravenous New Bag/Given 08/03/18 2007)  sodium chloride (PF) 0.9 % injection (  Given by Other 08/03/18 2110)  iohexol (OMNIPAQUE) 300 MG/ML solution 100 mL (100 mLs Intravenous Contrast Given 08/03/18 2029)  morphine 4 MG/ML injection 4 mg (4 mg Intravenous Given 08/03/18 2217)    Mobility walks

## 2018-08-03 NOTE — ED Notes (Signed)
Bed: LG49 Expected date:  Expected time:  Means of arrival:  Comments: Triage pt

## 2018-08-03 NOTE — H&P (Signed)
History and Physical    Ashlee Thomas ZOX:[id number] DOB: 1960/04/03 DOA: 08/03/2018  Referring MD/NP/PA:   PCP: Patient, No Pcp Per   Patient coming from:  The patient is coming from home.  At baseline, pt is independent for most of ADL.        Chief Complaint: Nausea, vomiting, abdominal pain  HPI: Ashlee Thomas is a 58 y.o. female with medical history significant of gastric bypass surgery, small bowel obstruction, hypertension, hyperlipidemia, DM, depression, who presents with nausea vomiting, abdominal pain.  Patient states that her symptoms started this morning, including nausea, vomiting and abdominal pain.  He has vomited more than 10 times with nonbloody non-biliary vomitus.  Her abdominal pain is located in central abdomen, constant, sharp, 10 out of 10 severity, nonradiating.  Last bowel movement was this morning.  Patient denies chest pain, shortness of breath, cough, fever or chills.  No symptoms of UTI or unilateral weakness. Of note, pt states that since May of this year she has had multiple bowel obstructions.  ED Course: pt was found to have WBC 18.9, lactic acid of 4.4, 2.7, negative COVID-19 test, renal function normal, temperature normal, blood pressure 148/94, tachycardia, tachypnea, oxygen sat 92-100% on room air, CT abdomen/pelvis that showed partial small bowel obstruction.  Patient is admitted to MedSurg bed as inpatient.  General surgeon, Dr. Derrell Lolling was consulted.  Review of Systems:   General: no fevers, chills, no body weight gain, has poor appetite, has fatigue HEENT: no blurry vision, hearing changes or sore throat Respiratory: no dyspnea, coughing, wheezing CV: no chest pain, no palpitations GI: has nausea, vomiting, abdominal pain, no diarrhea, no constipation GU: no dysuria, burning on urination, increased urinary frequency, hematuria  Ext: no leg edema Neuro: no unilateral weakness, numbness, or tingling, no vision change or hearing loss Skin: no  rash, no skin tear. MSK: No muscle spasm, no deformity, no limitation of range of movement in spin Heme: No easy bruising.  Travel history: No recent long distant travel.  Allergy: No Known Allergies  Past Medical History:  Diagnosis Date  . Bowel obstruction (HCC)   . Depression   . Diabetes mellitus   . Hyperlipidemia   . Hypertension     Past Surgical History:  Procedure Laterality Date  . gastric by pass    . HERNIA REPAIR    . OVARIAN CYST REMOVAL    . tonsillectomhy      Social History:  reports that she has quit smoking. She has never used smokeless tobacco. She reports that she does not drink alcohol or use drugs.  Family History:  Family History  Problem Relation Age of Onset  . Diabetes Mother      Prior to Admission medications   Medication Sig Start Date End Date Taking? Authorizing Provider  atorvastatin (LIPITOR) 10 MG tablet Take 10 mg by mouth every evening.   Yes [provider]  Exenatide ER (BYDUREON BCISE) 2 MG/0.85ML AUIJ Inject 2 mg into the skin once a week.   Yes [provider]  lisinopril-hydrochlorothiazide (ZESTORETIC) 20-12.5 MG tablet Take 1 tablet by mouth daily.   Yes [provider]  metFORMIN (GLUCOPHAGE-XR) 500 MG 24 hr tablet Take 500 mg by mouth daily with breakfast.   Yes [provider]  Multiple Vitamins-Iron (ONE-TABLET-DAILY/IRON PO) Take 1 tablet by mouth daily.   Yes [provider]  Multiple Vitamins-Minerals (WOMENS MULTI VITAMIN & MINERAL) TABS Take 1 tablet by mouth daily.   Yes [provider]  vitamin E 400 UNIT capsule Take 400 Units by mouth daily.   Yes [provider]    Physical Exam: Vitals:   08/03/18 2130 08/03/18 2200 08/03/18 2230 08/03/18 2300  BP: (!) 158/104 (!) 149/96 (!) 148/94 (!) 154/105  Pulse: (!) 109 96 (!) 102 (!) 105  Resp: (!) 21 20 19 19   Temp:      TempSrc:      SpO2: 100% 98% 92% 96%  Weight:      Height:       General: Not  in acute distress HEENT:       Eyes: PERRL, EOMI, no scleral icterus.       ENT: No discharge from the ears and nose, no pharynx injection, no tonsillar enlargement.        Neck: No JVD, no bruit, no mass felt. Heme: No neck lymph node enlargement. Cardiac: S1/S2, RRR, No murmurs, No gallops or rubs. Respiratory: No rales, wheezing, rhonchi or rubs. GI: distended, has diffused tenderness, no rebound pain, no organomegaly, BS present. GU: No hematuria Ext: No pitting leg edema bilaterally. 2+DP/PT pulse bilaterally. Musculoskeletal: No joint deformities, No joint redness or warmth, no limitation of ROM in spin. Skin: No rashes.  Neuro: Alert, oriented X3, cranial nerves II-XII grossly intact, moves all extremities normally. Psych: Patient is not psychotic, no suicidal or hemocidal ideation.  Labs on Admission: I have personally reviewed following labs and imaging studies  CBC: Recent Labs  Lab 08/03/18 1840  WBC 18.9*  HGB 15.2*  HCT 46.1*  MCV 91.7  PLT 297   Basic Metabolic Panel: Recent Labs  Lab 08/03/18 1840  NA 136  K 3.5  CL 101  CO2 17*  GLUCOSE 338*  BUN 22*  CREATININE 0.90  CALCIUM 9.8   GFR: Estimated Creatinine Clearance: 75.8 mL/min (by C-G formula based on SCr of 0.9 mg/dL). Liver Function Tests: Recent Labs  Lab 08/03/18 1840  AST 26  ALT 23  ALKPHOS 119  BILITOT 0.2*  PROT 7.8  ALBUMIN 4.6   Recent Labs  Lab 08/03/18 1840  LIPASE 62*   No results for input(s): AMMONIA in the last 168 hours. Coagulation Profile: No results for input(s): INR, PROTIME in the last 168 hours. Cardiac Enzymes: No results for input(s): CKTOTAL, CKMB, CKMBINDEX, TROPONINI in the last 168 hours. BNP (last 3 results) No results for input(s): PROBNP in the last 8760 hours. HbA1C: No results for input(s): HGBA1C in the last 72 hours. CBG: Recent Labs  Lab 08/03/18 1810  GLUCAP 274*   Lipid Profile: No results for input(s): CHOL, HDL, LDLCALC, TRIG,  CHOLHDL, LDLDIRECT in the last 72 hours. Thyroid Function Tests: No results for input(s): TSH, T4TOTAL, FREET4, T3FREE, THYROIDAB in the last 72 hours. Anemia Panel: No results for input(s): VITAMINB12, FOLATE, FERRITIN, TIBC, IRON, RETICCTPCT in the last 72 hours. Urine analysis: No results found for: COLORURINE, APPEARANCEUR, LABSPEC, PHURINE, GLUCOSEU, HGBUR, BILIRUBINUR, KETONESUR, PROTEINUR, UROBILINOGEN, NITRITE, LEUKOCYTESUR Sepsis Labs: @LABRCNTIP (procalcitonin:4,lacticidven:4) ) Recent Results (from the past 240 hour(s))  SARS Coronavirus 2 (CEPHEID - Performed in [facility] hospital lab), Hosp Order     Status: None   Collection Time: 08/03/18  7:32 PM   Specimen: Nasopharyngeal Swab  Result Value Ref Range Status   SARS Coronavirus 2 NEGATIVE NEGATIVE Final    Comment: (NOTE) If result is NEGATIVE SARS-CoV-2 target nucleic acids are NOT DETECTED. The SARS-CoV-2 RNA is generally detectable in upper and lower  respiratory specimens during the acute phase of  infection. The lowest  concentration of SARS-CoV-2 viral copies this assay can detect is 250  copies / mL. A negative result does not preclude SARS-CoV-2 infection  and should not be used as the sole basis for treatment or other  patient management decisions.  A negative result may occur with  improper specimen collection / handling, submission of specimen other  than nasopharyngeal swab, presence of viral mutation(s) within the  areas targeted by this assay, and inadequate number of viral copies  (<250 copies / mL). A negative result must be combined with clinical  observations, patient history, and epidemiological information. If result is POSITIVE SARS-CoV-2 target nucleic acids are DETECTED. The SARS-CoV-2 RNA is generally detectable in upper and lower  respiratory specimens dur ing the acute phase of infection.  Positive  results are indicative of active infection with SARS-CoV-2.  Clinical  correlation with  patient history and other diagnostic information is  necessary to determine patient infection status.  Positive results do  not rule out bacterial infection or co-infection with other viruses. If result is PRESUMPTIVE POSTIVE SARS-CoV-2 nucleic acids MAY BE PRESENT.   A presumptive positive result was obtained on the submitted specimen  and confirmed on repeat testing.  While 2019 novel coronavirus  (SARS-CoV-2) nucleic acids may be present in the submitted sample  additional confirmatory testing may be necessary for epidemiological  and / or clinical management purposes  to differentiate between  SARS-CoV-2 and other Sarbecovirus currently known to infect humans.  If clinically indicated additional testing with an alternate test  methodology [phone]) is advised. The SARS-CoV-2 RNA is generally  detectable in upper and lower respiratory sp ecimens during the acute  phase of infection. The expected result is Negative. Fact Sheet for Patients:  BoilerBrush.com.cy Fact Sheet for Healthcare Providers: https://pope.com/ This test is not yet approved or cleared by the [place] FDA and has been authorized for detection and/or diagnosis of SARS-CoV-2 by FDA under an Emergency Use Authorization (EUA).  This EUA will remain in effect (meaning this test can be used) for the duration of the COVID-19 declaration under Section 564(b)(1) of the Act, 21 U.S.C. section 360bbb-3(b)(1), unless the authorization is terminated or revoked sooner. Performed at [facility], [address] [place], [place] [address]      Radiological Exams on Admission: Ct Abdomen Pelvis W Contrast  Result Date: 08/03/2018 CLINICAL DATA:  Mid abdominal pain with frequent voiding beginning this morning. History of a previous bowel obstruction. EXAM: CT ABDOMEN AND PELVIS WITH CONTRAST TECHNIQUE: Multidetector CT imaging of the abdomen and pelvis was  performed using the standard protocol following bolus administration of intravenous contrast. CONTRAST:  OMNIPAQUE IOHEXOL 300 MG/ML  SOLN COMPARISON:  None. FINDINGS: Lower chest: Clear lung bases.  Heart normal size. Hepatobiliary: Small calcification over the dome of the right lobe. Liver normal in size. No mass. Status post cholecystectomy. Mild prominence of the intrahepatic biliary tree. Common bile duct is mildly prominent measuring 7 mm, presumed chronic. Pancreas: Unremarkable. No pancreatic ductal dilatation or surrounding inflammatory changes. Spleen: Normal in size without focal abnormality. Adrenals/Urinary Tract: Adrenal glands are unremarkable. Kidneys are normal, without renal calculi, focal lesion, or hydronephrosis. Bladder is unremarkable. 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 The stomach is mild-to-moderately distended. There is significant small bowel dilation, with small bowel having a diameter of 5.5 cm in the left upper quadrant. The distal small bowel is decompressed. The apparent transition is in  the right upper pelvis. There is no small bowel wall thickening or adjacent inflammation. The colon is mostly decompressed. Appendix not discretely seen, but there is no evidence of appendicitis. No colonic wall thickening or inflammation. Vascular/Lymphatic: Normal caliber aorta. Minor atherosclerotic changes. No other vascular abnormality. No enlarged lymph nodes. Reproductive: Uterus and bilateral adnexa are unremarkable. Other: No abdominal wall hernia.  No ascites. Musculoskeletal: Chronic bilateral pars defects at L5-S1 with a grade 1 anterolisthesis. No acute fractures. No osteoblastic or osteolytic lesions. IMPRESSION: 1. Partial small bowel obstruction, moderate in severity, with the transition point in the right upper pelvis, likely due to adhesions. 2. No bowel wall thickening or adjacent inflammation. No evidence  of ischemia. 3. No other acute abnormality. 4. 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 5. Minor aortic atherosclerosis. Electronically Signed   By: Amie Portland M.D.   On: 08/03/2018 20:55     EKG:  Not done in ED, will get one.   Assessment/Plan Principal Problem:   SBO (small bowel obstruction) (HCC) Active Problems:   Hypertension   Hyperlipidemia   Diabetes mellitus without complication (HCC)   Sepsis (HCC)   SBO (small bowel obstruction) and sepsis: Patient meets criteria for sepsis with leukocytosis, tachycardia and tachypnea.  Lactic acid is elevated 4.4, 2.7.  Currently hemodynamically stable.  General surgeon, Dr. Derrell Lolling was consulted.  -Admit to med surg bed as inpt - iv zosyn -will get Procalcitonin and trend lactic acid levels per sepsis protocol. -NPO   -prn NG tube -->pt refused NG tube -morphine prn pain -Prn Zofran prn nausea   -IVF: 2L NS and then 75 cc/h -INR/PTT/type & screen -Follow-up general surgeons recommendation, which is highly appreciated. -hold oral meds now  Hypertension: -IV hydralazine  Hyperlipidemia: -hold lipitor now  Diabetes mellitus without complication (HCC): Last A1c 11.0, poorly controled. Patient is taking metformin and Bydureon at home -SSI    Inpatient status:  # Patient requires inpatient status due to high intensity of service, high risk for further deterioration and high frequency of surveillance required.  I certify that at the point of admission it is my clinical judgment that the patient will require inpatient hospital care spanning beyond 2 midnights from the point of admission.  . This patient has multiple chronic comorbidities including gastric bypass surgery, small bowel obstruction, hypertension, hyperlipidemia, DM, depression .  Marland Kitchen Now patient has presenting with recurrent small bowel obstruction and sepsis . The worrisome physical exam findings include abdominal distention  and abdominal tenderness . The initial radiographic and laboratory data are worrisome because of leukocytosis, elevated lactic acid. . Current medical needs: please see my assessment and plan . Predictability of an adverse outcome (risk): Patient has multiple comorbidities, now presents with recurrent small bowel obstruction with sepsis.  Patient will be treated conservatively first, but she is at high risk for deteriorating.  If no response to conservative treatment, patient will need surgery.  Patient will need to be treated in hospital for at least 2 days.      DVT ppx: SCD Code Status: Full code Family Communication: None at bed side.   Disposition Plan:  Anticipate discharge back to previous home environment Consults called:  Dr. Derrell Lolling of surgery Admission status: medical floor/inpt     Date of Service 08/03/2018    Lorretta Harp [facility]   If 7PM-7AM, please contact night-coverage [email or web address] Password [facility] 08/03/2018, 11:28 PM

## 2018-08-03 NOTE — Consult Note (Signed)
Reason for Consult:SBO Referring Physician: Dr.Butler  Ashlee Thomas is an 58 y.o. female.  HPI: Pt is a 58 y/o F with a past history of gastric bypass in [place] in 2011, diabetes, hypertension, hyperlipidemia.  Patient comes in secondary to abdominal pain, nausea vomiting.  Patient states that since May of this year she has had multiple bowel obstructions.  She states that she has been hospitalized both in [place] and [place].  Patient is originally from [place] and was up in [place] visiting her daughter.  Patient states that today she was returning back to [place] and made it as far south to [place] and had abdominal pain that she states was generalized.  She states this was associated with nausea vomiting.  Secondary to this she returned to [place].  Upon evaluation the ER she underwent CT scan as well as laboratory studies.  CT scan did reveal signs consistent with small bowel obstruction.  I did review the CT scan personally.  Patient also had a leukocytosis.  General surgery was consulted for further evaluation and management.  Of note the patient has a lower midline incision that she states is secondary to right ovarian mass removal.  She is unsure of the date.  Past Medical History:  Diagnosis Date  . Bowel obstruction (HCC)   . Depression   . Diabetes mellitus   . Hyperlipidemia   . Hypertension     Past Surgical History:  Procedure Laterality Date  . gastric by pass    . HERNIA REPAIR    . OVARIAN CYST REMOVAL    . tonsillectomhy      Family History  Problem Relation Age of Onset  . Diabetes Mother     Social History:  reports that she has quit smoking. She has never used smokeless tobacco. She reports that she does not drink alcohol or use drugs.  Allergies: No Known Allergies  Medications: I have reviewed the patient's current medications.  Results for orders placed or performed during the hospital encounter of 08/03/18 (from the past  48 hour(s))  CBG monitoring, ED     Status: Abnormal   Collection Time: 08/03/18  6:10 PM  Result Value Ref Range   Glucose-Capillary 274 (H) 70 - 99 mg/dL  Lipase, blood     Status: Abnormal   Collection Time: 08/03/18  6:40 PM  Result Value Ref Range   Lipase 62 (H) 11 - 51 U/L    Comment: Performed at [facility], [address] [place], [place] [address]  Comprehensive metabolic panel     Status: Abnormal   Collection Time: 08/03/18  6:40 PM  Result Value Ref Range   Sodium 136 135 - 145 mmol/L   Potassium 3.5 3.5 - 5.1 mmol/L   Chloride 101 98 - 111 mmol/L   CO2 17 (L) 22 - 32 mmol/L   Glucose, Bld 338 (H) 70 - 99 mg/dL   BUN 22 (H) 6 - 20 mg/dL   Creatinine, Ser 6.040.90 0.44 - 1.00 mg/dL   Calcium 9.8 8.9 - 54.010.3 mg/dL   Total Protein 7.8 6.5 - 8.1 g/dL   Albumin 4.6 3.5 - 5.0 g/dL   AST 26 15 - 41 U/L   ALT 23 0 - 44 U/L   Alkaline Phosphatase 119 38 - 126 U/L   Total Bilirubin 0.2 (L) 0.3 - 1.2 mg/dL   GFR calc non Af Amer >60 >60 mL/min   GFR calc Af Amer >60 >60 mL/min  Anion gap 18 (H) 5 - 15    Comment: Performed at [facility], [address] [place], [place] [address]  CBC     Status: Abnormal   Collection Time: 08/03/18  6:40 PM  Result Value Ref Range   WBC 18.9 (H) 4.0 - 10.5 K/uL   RBC 5.03 3.87 - 5.11 MIL/uL   Hemoglobin 15.2 (H) 12.0 - 15.0 g/dL   HCT 19.146.1 (H) 47.836.0 - 29.546.0 %   MCV 91.7 80.0 - 100.0 fL   MCH 30.2 26.0 - 34.0 pg   MCHC 33.0 30.0 - 36.0 g/dL   RDW 62.112.5 30.811.5 - 65.715.5 %   Platelets 297 150 - 400 K/uL   nRBC 0.0 0.0 - 0.2 %    Comment: Performed at [facility], [address] [place], [place] [address]  Lactic acid, plasma     Status: Abnormal   Collection Time: 08/03/18  6:40 PM  Result Value Ref Range   Lactic Acid, Venous 4.4 (HH) 0.5 - 1.9 mmol/L    Comment: CRITICAL RESULT CALLED TO, READ BACK BY AND VERIFIED WITH: C.FRANKLIN AT 1952 ON 08/03/18 BY N.THOMPSON Performed at  [facility], [address] [place], [place] [address]   SARS Coronavirus 2 (CEPHEID - Performed in [facility] hospital lab), Hosp Order     Status: None   Collection Time: 08/03/18  7:32 PM   Specimen: Nasopharyngeal Swab  Result Value Ref Range   SARS Coronavirus 2 NEGATIVE NEGATIVE    Comment: (NOTE) If result is NEGATIVE SARS-CoV-2 target nucleic acids are NOT DETECTED. The SARS-CoV-2 RNA is generally detectable in upper and lower  respiratory specimens during the acute phase of infection. The lowest  concentration of SARS-CoV-2 viral copies this assay can detect is 250  copies / mL. A negative result does not preclude SARS-CoV-2 infection  and should not be used as the sole basis for treatment or other  patient management decisions.  A negative result may occur with  improper specimen collection / handling, submission of specimen other  than nasopharyngeal swab, presence of viral mutation(s) within the  areas targeted by this assay, and inadequate number of viral copies  (<250 copies / mL). A negative result must be combined with clinical  observations, patient history, and epidemiological information. If result is POSITIVE SARS-CoV-2 target nucleic acids are DETECTED. The SARS-CoV-2 RNA is generally detectable in upper and lower  respiratory specimens dur ing the acute phase of infection.  Positive  results are indicative of active infection with SARS-CoV-2.  Clinical  correlation with patient history and other diagnostic information is  necessary to determine patient infection status.  Positive results do  not rule out bacterial infection or co-infection with other viruses. If result is PRESUMPTIVE POSTIVE SARS-CoV-2 nucleic acids MAY BE PRESENT.   A presumptive positive result was obtained on the submitted specimen  and confirmed on repeat testing.  While 2019 novel coronavirus  (SARS-CoV-2) nucleic acids may be present in the submitted sample   additional confirmatory testing may be necessary for epidemiological  and / or clinical management purposes  to differentiate between  SARS-CoV-2 and other Sarbecovirus currently known to infect humans.  If clinically indicated additional testing with an alternate test  methodology [phone] is advised. The SARS-CoV-2 RNA is generally  detectable in upper and lower respiratory sp ecimens during the acute  phase of infection. The expected result is Negative. Fact Sheet for Patients:  BoilerBrush.com.cyhttps://www.fda.gov/media/136312/download Fact Sheet for Healthcare  Providers: https://pope.com/https://www.fda.gov/media/136313/download This test is not yet approved or cleared by the [place] FDA and has been authorized for detection and/or diagnosis of SARS-CoV-2 by FDA under an Emergency Use Authorization (EUA).  This EUA will remain in effect (meaning this test can be used) for the duration of the COVID-19 declaration under Section 564(b)(1) of the Act, 21 U.S.C. section 360bbb-3(b)(1), unless the authorization is terminated or revoked sooner. Performed at [facility], [address] [place], [place] [address]   Lactic acid, plasma     Status: Abnormal   Collection Time: 08/03/18  8:40 PM  Result Value Ref Range   Lactic Acid, Venous 2.7 (HH) 0.5 - 1.9 mmol/L    Comment: CRITICAL RESULT CALLED TO, READ BACK BY AND VERIFIED WITH: J.OXENDINE AT 2213 ON 08/03/18 BY N.THOMPSON Performed at [facility], [address] [place], [place] [address]     Ct Abdomen Pelvis W Contrast  Result Date: 08/03/2018 CLINICAL DATA:  Mid abdominal pain with frequent voiding beginning this morning. History of a previous bowel obstruction. EXAM: CT ABDOMEN AND PELVIS WITH CONTRAST TECHNIQUE: Multidetector CT imaging of the abdomen and pelvis was performed using the standard protocol following bolus administration of intravenous contrast. CONTRAST:  100mL OMNIPAQUE IOHEXOL 300 MG/ML  SOLN  COMPARISON:  None. FINDINGS: Lower chest: Clear lung bases.  Heart normal size. Hepatobiliary: Small calcification over the dome of the right lobe. Liver normal in size. No mass. Status post cholecystectomy. Mild prominence of the intrahepatic biliary tree. Common bile duct is mildly prominent measuring 7 mm, presumed chronic. Pancreas: Unremarkable. No pancreatic ductal dilatation or surrounding inflammatory changes. Spleen: Normal in size without focal abnormality. Adrenals/Urinary Tract: Adrenal glands are unremarkable. Kidneys are normal, without renal calculi, focal lesion, or hydronephrosis. Bladder is unremarkable. 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 The stomach is mild-to-moderately distended. There is significant small bowel dilation, with small bowel having a diameter of 5.5 cm in the left upper quadrant. The distal small bowel is decompressed. The apparent transition is in the right upper pelvis. There is no small bowel wall thickening or adjacent inflammation. The colon is mostly decompressed. Appendix not discretely seen, but there is no evidence of appendicitis. No colonic wall thickening or inflammation. Vascular/Lymphatic: Normal caliber aorta. Minor atherosclerotic changes. No other vascular abnormality. No enlarged lymph nodes. Reproductive: Uterus and bilateral adnexa are unremarkable. Other: No abdominal wall hernia.  No ascites. Musculoskeletal: Chronic bilateral pars defects at L5-S1 with a grade 1 anterolisthesis. No acute fractures. No osteoblastic or osteolytic lesions. IMPRESSION: 1. Partial small bowel obstruction, moderate in severity, with the transition point in the right upper pelvis, likely due to adhesions. 2. No bowel wall thickening or adjacent inflammation. No evidence of ischemia. 3. No other acute abnormality. 4. 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 5. Minor aortic  atherosclerosis. Electronically Signed   By: Amie Portlandavid  Ormond M.D.   On: 08/03/2018 20:55    Review of Systems  Constitutional: Negative for chills, fever and malaise/fatigue.  HENT: Negative for ear discharge, hearing loss and sore throat.   Eyes: Negative for blurred vision and discharge.  Respiratory: Negative for cough and shortness of breath.   Cardiovascular: Negative for chest pain, orthopnea and leg swelling.  Gastrointestinal: Negative for abdominal pain, constipation, diarrhea, heartburn, nausea and vomiting.  Musculoskeletal: Negative for myalgias and neck pain.  Skin: Negative for itching and rash.  Neurological: Negative for dizziness,  focal weakness, seizures and loss of consciousness.  Endo/Heme/Allergies: Negative for environmental allergies. Does not bruise/bleed easily.  Psychiatric/Behavioral: Negative for depression and suicidal ideas.  All other systems reviewed and are negative.  Blood pressure (!) 149/96, pulse 96, temperature 98.4 F (36.9 C), temperature source Oral, resp. rate 20, height 5\' 7"  (1.702 m), weight 81.6 kg, SpO2 98 %. Physical Exam  Constitutional: She is oriented to person, place, and time. Vital signs are normal. She appears well-developed and well-nourished.  Conversant No acute distress  Eyes: Lids are normal. No scleral icterus.  No lid lag Moist conjunctiva  Neck: No tracheal tenderness present. No thyromegaly present.  No cervical lymphadenopathy  Cardiovascular: Normal rate, regular rhythm and intact distal pulses.  No murmur heard. Respiratory: Effort normal and breath sounds normal. She has no wheezes. She has no rales.  GI: Soft. She exhibits distension. There is no hepatosplenomegaly. There is abdominal tenderness (x4Q). There is no rebound and no guarding. No hernia.  Neurological: She is alert and oriented to person, place, and time.  Normal gait and station  Skin: Skin is warm. No rash noted. No cyanosis. Nails show no clubbing.   Normal skin turgor  Psychiatric: Judgment normal.  Appropriate affect    Assessment/Plan: 58 year old female with likely small bowel obstruction due to adhesions History of gastric bypass Hypertension Diabetes Hyperlipidemia  Plan: 1.  I discussed with the patient that she may require NG tube placement by IR.  Patient refused NG tube placement and management of her bowel obstruction.  We will continue with SBO protocol. 2.  Discussed with the patient this may operative management if this remains unresolved over 24 to 48 hours. 3.  We will continue to follow.  Ralene Ok 08/03/2018, 10:31 PM

## 2018-08-03 NOTE — Progress Notes (Signed)
Pharmacy Antibiotic Note  Ashlee Thomas is a 58 y.o. female admitted on 08/03/2018 with intra-abdominal infection.  Pharmacy has been consulted for zosyn dosing.  Plan: Zosyn 3.375g IV q8h (4 hour infusion).  F/u scr/cultures  Height: 5\' 7"  (170.2 cm) Weight: 180 lb (81.6 kg) IBW/kg (Calculated) : 61.6  Temp (24hrs), Avg:98.4 F (36.9 C), Min:98.4 F (36.9 C), Max:98.4 F (36.9 C)  Recent Labs  Lab 08/03/18 1840 08/03/18 2040  WBC 18.9*  --   CREATININE 0.90  --   LATICACIDVEN 4.4* 2.7*    Estimated Creatinine Clearance: 75.8 mL/min (by C-G formula based on SCr of 0.9 mg/dL).    No Known Allergies  Antimicrobials this admission: 7/7 roc/flagyl >> x1 Ed 7/8 zosyn >>  Dose adjustments this admission:   Microbiology results:  BCx:   UCx:    Sputum:    MRSA PCR:  Thank you for allowing pharmacy to be a part of this patient's care.  Dorrene German 08/03/2018 11:27 PM

## 2018-08-03 NOTE — ED Provider Notes (Addendum)
[facility]-EMERGENCY DEPT Provider Note   CSN: [id number] Arrival date & time: 08/03/18  1745     History   Chief Complaint Chief Complaint  Patient presents with   Abdominal Pain   Emesis    HPI Ashlee Thomas is a 58 y.o. female.  She has a history of diabetes hypertension gastric bypass and has had a history of bowel obstructions.  She says her last episode was about a month ago.  She is complaining of acute onset of epigastric abdominal pain 10 out of 10 intensity that started this morning with multiple episodes of vomiting.  Last bowel movement was this morning.  She says her emesis is bile colored no blood.  No fevers or chills.  This is similar to her prior bowel obstructions.  She said she can have an NG tube secondary to her gastric bypass.     The history is provided by the patient.  Abdominal Pain Pain location:  Epigastric Pain quality: aching and stabbing   Pain radiates to:  Does not radiate Pain severity:  Severe Onset quality:  Sudden Timing:  Constant Progression:  Unchanged Chronicity:  Recurrent Context: previous surgery and recent travel   Context: not trauma   Relieved by:  Nothing Worsened by:  Nothing Ineffective treatments:  None tried Associated symptoms: nausea and vomiting   Associated symptoms: no chest pain, no cough, no dysuria, no fever, no hematemesis, no hematochezia, no hematuria, no melena, no shortness of breath, no sore throat and no vaginal bleeding   Emesis Associated symptoms: abdominal pain   Associated symptoms: no cough, no fever, no headaches and no sore throat     Past Medical History:  Diagnosis Date   Bowel obstruction (HCC)    Depression    Diabetes mellitus    Hyperlipidemia    Hypertension     Patient Active Problem List   Diagnosis Date Noted   Depression     Past Surgical History:  Procedure Laterality Date   gastric by pass     HERNIA REPAIR     OVARIAN CYST REMOVAL      tonsillectomhy       OB History   No obstetric history on file.      Home Medications    Prior to Admission medications   Medication Sig Start Date End Date Taking? Authorizing Provider  aspirin 81 MG tablet Take 81 mg by mouth daily.    [provider]  exenatide (BYETTA) 10 MCG/0.04ML SOLN Inject into the skin 2 (two) times daily with a meal.    [provider]  ezetimibe-simvastatin (VYTORIN) 10-80 MG per tablet Take 1 tablet by mouth at bedtime.    [provider]  FLUoxetine (PROZAC) 40 MG capsule Take 40 mg by mouth daily.    [provider]  glyBURIDE (DIABETA) 5 MG tablet Take 5 mg by mouth daily with breakfast.    [provider]  lisinopril-hydrochlorothiazide (PRINZIDE,ZESTORETIC) 20-25 MG per tablet Take 1 tablet by mouth daily.    [provider]  metFORMIN (GLUCOPHAGE) 1000 MG tablet Take 1,000 mg by mouth 2 (two) times daily with a meal.    [provider]    Family History Family History  Problem Relation Age of Onset   Diabetes Mother     Social History Social History   Tobacco Use   Smoking status: Former Smoker   Smokeless tobacco: Never Used  Substance Use Topics   Alcohol use: Never  Frequency: Never   Drug use: Never     Allergies   Patient has no known allergies.   Review of Systems Review of Systems  Constitutional: Positive for diaphoresis. Negative for fever.  HENT: Negative for sore throat.   Eyes: Negative for visual disturbance.  Respiratory: Negative for cough and shortness of breath.   Cardiovascular: Negative for chest pain.  Gastrointestinal: Positive for abdominal pain, nausea and vomiting. Negative for hematemesis, hematochezia and melena.  Genitourinary: Negative for dysuria, hematuria and vaginal bleeding.  Musculoskeletal: Negative for neck pain.  Skin: Negative for rash.  Neurological: Negative for headaches.     Physical Exam Updated Vital  Signs BP (!) 145/93    Pulse 98    Temp 98.4 F (36.9 C) (Oral)    Resp (!) 27    Ht 5\' 7"  (1.702 m)    Wt 81.6 kg    SpO2 100%    BMI 28.19 kg/m   Physical Exam Vitals signs and nursing note reviewed.  Constitutional:      General: She is not in acute distress.    Appearance: She is well-developed. She is diaphoretic.  HENT:     Head: Normocephalic and atraumatic.  Eyes:     Conjunctiva/sclera: Conjunctivae normal.  Neck:     Musculoskeletal: Neck supple.  Cardiovascular:     Rate and Rhythm: Regular rhythm. Tachycardia present.     Heart sounds: No murmur.  Pulmonary:     Effort: Pulmonary effort is normal. No respiratory distress.     Breath sounds: Normal breath sounds. No stridor. No wheezing.  Abdominal:     Palpations: Abdomen is soft.     Tenderness: There is generalized abdominal tenderness. There is no guarding or rebound.  Musculoskeletal: Normal range of motion.        General: No tenderness.  Skin:    Capillary Refill: Capillary refill takes less than 2 seconds.     Coloration: Skin is not jaundiced.     Findings: No erythema.  Neurological:     General: No focal deficit present.     Mental Status: She is alert and oriented to person, place, and time.     GCS: GCS eye subscore is 4. GCS verbal subscore is 5. GCS motor subscore is 6.      ED Treatments / Results  Labs (all labs ordered are listed, but only abnormal results are displayed) Labs Reviewed  LIPASE, BLOOD - Abnormal; Notable for the following components:      Result Value   Lipase 62 (*)    All other components within normal limits  COMPREHENSIVE METABOLIC PANEL - Abnormal; Notable for the following components:   CO2 17 (*)    Glucose, Bld 338 (*)    BUN 22 (*)    Total Bilirubin 0.2 (*)    Anion gap 18 (*)    All other components within normal limits  CBC - Abnormal; Notable for the following components:   WBC 18.9 (*)    Hemoglobin 15.2 (*)    HCT 46.1 (*)    All other components  within normal limits  LACTIC ACID, PLASMA - Abnormal; Notable for the following components:   Lactic Acid, Venous 4.4 (*)    All other components within normal limits  LACTIC ACID, PLASMA - Abnormal; Notable for the following components:   Lactic Acid, Venous 2.7 (*)    All other components within normal limits  CBG MONITORING, ED - Abnormal; Notable for the following components:  Glucose-Capillary 274 (*)    All other components within normal limits  SARS CORONAVIRUS 2 (HOSPITAL ORDER, PERFORMED IN [facility])  URINALYSIS, ROUTINE W REFLEX MICROSCOPIC  PROTIME-INR  APTT  TYPE AND SCREEN    EKG None  Radiology Ct Abdomen Pelvis W Contrast  Result Date: 08/03/2018 CLINICAL DATA:  Mid abdominal pain with frequent voiding beginning this morning. History of a previous bowel obstruction. EXAM: CT ABDOMEN AND PELVIS WITH CONTRAST TECHNIQUE: Multidetector CT imaging of the abdomen and pelvis was performed using the standard protocol following bolus administration of intravenous contrast. CONTRAST:  100mL OMNIPAQUE IOHEXOL 300 MG/ML  SOLN COMPARISON:  None. FINDINGS: Lower chest: Clear lung bases.  Heart normal size. Hepatobiliary: Small calcification over the dome of the right lobe. Liver normal in size. No mass. Status post cholecystectomy. Mild prominence of the intrahepatic biliary tree. Common bile duct is mildly prominent measuring 7 mm, presumed chronic. Pancreas: Unremarkable. No pancreatic ductal dilatation or surrounding inflammatory changes. Spleen: Normal in size without focal abnormality. Adrenals/Urinary Tract: Adrenal glands are unremarkable. Kidneys are normal, without renal calculi, focal lesion, or hydronephrosis. Bladder is unremarkable. 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 The stomach is mild-to-moderately distended. There is significant small bowel dilation, with small bowel having a diameter of 5.5  cm in the left upper quadrant. The distal small bowel is decompressed. The apparent transition is in the right upper pelvis. There is no small bowel wall thickening or adjacent inflammation. The colon is mostly decompressed. Appendix not discretely seen, but there is no evidence of appendicitis. No colonic wall thickening or inflammation. Vascular/Lymphatic: Normal caliber aorta. Minor atherosclerotic changes. No other vascular abnormality. No enlarged lymph nodes. Reproductive: Uterus and bilateral adnexa are unremarkable. Other: No abdominal wall hernia.  No ascites. Musculoskeletal: Chronic bilateral pars defects at L5-S1 with a grade 1 anterolisthesis. No acute fractures. No osteoblastic or osteolytic lesions. IMPRESSION: 1. Partial small bowel obstruction, moderate in severity, with the transition point in the right upper pelvis, likely due to adhesions. 2. No bowel wall thickening or adjacent inflammation. No evidence of ischemia. 3. No other acute abnormality. 4. 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 5. Minor aortic atherosclerosis. Electronically Signed   By: Amie Portlandavid  Ormond M.D.   On: 08/03/2018 20:55    Procedures .Critical Care Performed by: Terrilee FilesButler, Jaxie Racanelli C, MD Authorized by: Terrilee FilesButler, Franklin Baumbach C, MD   Critical care provider statement:    Critical care time (minutes):  35   Critical care time was exclusive of:  Separately billable procedures and treating other patients   Critical care was necessary to treat or prevent imminent or life-threatening deterioration of the following conditions: bowel obstruction.   Critical care was time spent personally by me on the following activities:  Discussions with consultants, evaluation of patient's response to treatment, examination of patient, ordering and performing treatments and interventions, ordering and review of laboratory studies, ordering and review of radiographic studies, pulse oximetry, re-evaluation of  patient's condition, obtaining history from patient or surrogate, review of old charts and development of treatment plan with patient or surrogate   I assumed direction of critical care for this patient from another provider in my specialty: no     (including critical care time)  Medications Ordered in ED Medications  sodium chloride 0.9 % bolus 1,000 mL (has no administration in time range)  ondansetron (ZOFRAN) injection 4 mg (has no administration in time range)  morphine 4 MG/ML injection 4 mg (has no administration in time range)  ondansetron (ZOFRAN-ODT) disintegrating tablet 4 mg (4 mg Oral Given 08/03/18 1815)  morphine 4 MG/ML injection 4 mg (4 mg Intravenous Given 08/03/18 1857)     Initial Impression / Assessment and Plan / ED Course  I have reviewed the triage vital signs and the nursing notes.  Pertinent labs & imaging results that were available during my care of the patient were reviewed by me and considered in my medical decision making (see chart for details).  Clinical Course as of Aug 02 2328  Tue Aug 03, 2018  2140 Differential diagnosis includes differential includes bowel obstruction, perforation.  She is received some IV fluids.  I started her on antibiotics due to her elevated lactate and elevated white blood cell count.  She has had no fever here.  On return from CAT scan she said her symptoms are moderately controlled right now.  CT shows a P SBO.  I talked to Dr. Rosendo Gros from general surgery who will evaluate the patient in the ED.   [MB]  2228 Patient seen by Dr. Rosendo Gros from general surgery.  He is recommending a medical admission and they will follow along and he will put some small bowel obstruction orders in for the patient.  She again refused the NG tube by him.  He is considering getting IR to place it under fluoroscopy if she would agree to it.   [MB]  2253 Discussed with Dr. Blaine Hamper from the hospitalist service who accepts for admission.   [MB]    Clinical  Course User Index [MB] Hayden Rasmussen, MD   Ashlee Thomas was evaluated in Emergency Department on 08/03/2018 for the symptoms described in the history of present illness. She was evaluated in the context of the global COVID-19 pandemic, which necessitated consideration that the patient might be at risk for infection with the SARS-CoV-2 virus that causes COVID-19. Institutional protocols and algorithms that pertain to the evaluation of patients at risk for COVID-19 are in a state of rapid change based on information released by regulatory bodies including the CDC and federal and state organizations. These policies and algorithms were followed during the patient's care in the ED.      Final Clinical Impressions(s) / ED Diagnoses   Final diagnoses:  Small bowel obstruction [facility])    ED Discharge Orders    None       Hayden Rasmussen, MD 08/03/18 2330    Hayden Rasmussen, MD 08/17/18 [phone]

## 2018-08-04 ENCOUNTER — Inpatient Hospital Stay (HOSPITAL_COMMUNITY): Payer: Managed Care, Other (non HMO)

## 2018-08-04 LAB — URINALYSIS, ROUTINE W REFLEX MICROSCOPIC
Bacteria, UA: NONE SEEN
Bilirubin Urine: NEGATIVE
Glucose, UA: >=500 mg/dL — AB
Ketones, ur: 20 mg/dL — AB
Leukocytes,Ua: NEGATIVE
Nitrite: NEGATIVE
Protein, ur: NEGATIVE mg/dL
Specific Gravity, Urine: >1.046 — ABNORMAL HIGH (ref 1.005–1.030)
pH: 5 (ref 5.0–8.0)

## 2018-08-04 LAB — GLUCOSE, CAPILLARY
Glucose-Capillary: 107 mg/dL — ABNORMAL HIGH (ref 70–99)
Glucose-Capillary: 116 mg/dL — ABNORMAL HIGH (ref 70–99)
Glucose-Capillary: 128 mg/dL — ABNORMAL HIGH (ref 70–99)
Glucose-Capillary: 178 mg/dL — ABNORMAL HIGH (ref 70–99)
Glucose-Capillary: 247 mg/dL — ABNORMAL HIGH (ref 70–99)
Glucose-Capillary: 384 mg/dL — ABNORMAL HIGH (ref 70–99)

## 2018-08-04 LAB — BASIC METABOLIC PANEL
Anion gap: 12 (ref 5–15)
BUN: 23 mg/dL — ABNORMAL HIGH (ref 6–20)
CO2: 20 mmol/L — ABNORMAL LOW (ref 22–32)
Calcium: 8.4 mg/dL — ABNORMAL LOW (ref 8.9–10.3)
Chloride: 105 mmol/L (ref 98–111)
Creatinine, Ser: 0.71 mg/dL (ref 0.44–1.00)
GFR calc Af Amer: >60 mL/min (ref >60)
GFR calc non Af Amer: >60 mL/min (ref >60)
Glucose, Bld: 373 mg/dL — ABNORMAL HIGH (ref 70–99)
Potassium: 4.5 mmol/L (ref 3.5–5.1)
Sodium: 137 mmol/L (ref 135–145)

## 2018-08-04 LAB — LACTIC ACID, PLASMA
Lactic Acid, Venous: 1.7 mmol/L (ref 0.5–1.9)
Lactic Acid, Venous: 1.2 mmol/L (ref 0.5–1.9)

## 2018-08-04 LAB — TYPE AND SCREEN
ABO/RH(D): A POS
Antibody Screen: NEGATIVE

## 2018-08-04 LAB — HIV ANTIBODY (ROUTINE TESTING W REFLEX): HIV Screen 4th Generation wRfx: NONREACTIVE

## 2018-08-04 LAB — CBC
HCT: 45 % (ref 36.0–46.0)
Hemoglobin: 14.2 g/dL (ref 12.0–15.0)
MCH: 29.6 pg (ref 26.0–34.0)
MCHC: 31.6 g/dL (ref 30.0–36.0)
MCV: 93.9 fL (ref 80.0–100.0)
Platelets: 203 10*3/uL (ref 150–400)
RBC: 4.79 MIL/uL (ref 3.87–5.11)
RDW: 12.4 % (ref 11.5–15.5)
WBC: 13.1 10*3/uL — ABNORMAL HIGH (ref 4.0–10.5)
nRBC: 0 % (ref 0.0–0.2)

## 2018-08-04 LAB — PROTIME-INR
INR: 1 (ref 0.8–1.2)
Prothrombin Time: 13 seconds (ref 11.4–15.2)

## 2018-08-04 LAB — ABO/RH: ABO/RH(D): A POS

## 2018-08-04 LAB — APTT: aPTT: 26 seconds (ref 24–36)

## 2018-08-04 LAB — PROCALCITONIN: Procalcitonin: 0.6 ng/mL

## 2018-08-04 MED ORDER — ACETAMINOPHEN 325 MG PO TABS: 650.0000 mg | ORAL_TABLET | Freq: Four times a day (QID) | ORAL | Status: DC | PRN

## 2018-08-04 MED ORDER — DIATRIZOATE MEGLUMINE & SODIUM 66-10 % PO SOLN
90.0000 mL | Freq: Once | ORAL | Status: AC
  Administered 2018-08-04: 09:00:00 90 mL via ORAL
  Filled 2018-08-04: qty 90

## 2018-08-04 MED ORDER — INSULIN ASPART 100 UNIT/ML ~~LOC~~ SOLN
0.0000 [IU] | Freq: Every day | SUBCUTANEOUS | Status: DC
  Administered 2018-08-04: 5 [IU] via SUBCUTANEOUS

## 2018-08-04 MED ORDER — ACETAMINOPHEN 650 MG RE SUPP: 650.0000 mg | Freq: Four times a day (QID) | RECTAL | Status: DC | PRN

## 2018-08-04 MED ORDER — INSULIN ASPART 100 UNIT/ML ~~LOC~~ SOLN
0.0000 [IU] | Freq: Three times a day (TID) | SUBCUTANEOUS | Status: DC
  Administered 2018-08-04: 2 [IU] via SUBCUTANEOUS
  Administered 2018-08-04: 3 [IU] via SUBCUTANEOUS

## 2018-08-04 MED ORDER — INSULIN ASPART 100 UNIT/ML ~~LOC~~ SOLN
0.0000 [IU] | SUBCUTANEOUS | Status: DC
  Administered 2018-08-04 – 2018-08-05 (×3): 2 [IU] via SUBCUTANEOUS

## 2018-08-04 MED ORDER — PROMETHAZINE HCL 25 MG/ML IJ SOLN
12.5000 mg | Freq: Once | INTRAMUSCULAR | Status: AC
  Administered 2018-08-04: 12.5 mg via INTRAVENOUS
  Filled 2018-08-04: qty 1

## 2018-08-04 NOTE — Progress Notes (Addendum)
    CC: Abdominal pain  Subjective: She feels much better this a.m. no flatus or BM.  Objective: Vital signs in last 24 hours: Temp:  [97.9 F (36.6 C)-98.4 F (36.9 C)] 98.1 F (36.7 C) (07/08 0506) Pulse Rate:  [83-118] 106 (07/08 0506) Resp:  [14-31] 14 (07/08 0506) BP: (106-158)/(75-105) 106/75 (07/08 0506) SpO2:  [92 %-100 %] 97 % (07/08 0506) Weight:  [81.6 kg] 81.6 kg (07/07 1802) Last BM Date: 08/03/18 NPO 654 IV Urine 800 Afebrile, BP is better and RR is normal now Lactate dopwn to 1.2 wBC 18.9>>13.1 Glucose still in the 300 range CT 7/7:There is significant small bowel dilation, with small bowel having a diameter of 5.5 cm in the left upper quadrant. The distal small bowel is decompressed. The apparent transition is in the right upper pelvis. There is no small bowel wall thickening or adjacent inflammation  Intake/Output from previous day: 07/07 0701 - 07/08 0700 In: 654.7 [I.V.:618.9; IV Piggyback:35.8] Out: 800 [Urine:800] Intake/Output this shift: No intake/output data recorded.  General appearance: alert, cooperative and no distress Resp: clear to auscultation bilaterally GI: Soft nontender, no flatus or BM so far.  Bowel sounds still hypoactive.  Lab Results:  Recent Labs    08/03/18 1840 08/04/18 0020  WBC 18.9* 13.1*  HGB 15.2* 14.2  HCT 46.1* 45.0  PLT 297 203    BMET Recent Labs    08/03/18 1840 08/04/18 0020  NA 136 137  K 3.5 4.5  CL 101 105  CO2 17* 20*  GLUCOSE 338* 373*  BUN 22* 23*  CREATININE 0.90 0.71  CALCIUM 9.8 8.4*   PT/INR Recent Labs    08/04/18 0008  LABPROT 13.0  INR 1.0    Recent Labs  Lab 08/03/18 1840  AST 26  ALT 23  ALKPHOS 119  BILITOT 0.2*  PROT 7.8  ALBUMIN 4.6     Lipase     Component Value Date/Time   LIPASE 62 (H) 08/03/2018 1840     Medications: . diatrizoate meglumine-sodium  90 mL Per NG tube Once  . insulin aspart  0-5 Units Subcutaneous QHS  . insulin aspart  0-9 Units  Subcutaneous TID WC   . sodium chloride 100 mL/hr at 08/03/18 2348  . piperacillin-tazobactam (ZOSYN)  IV 3.375 g (08/04/18 0308)    Assessment/Plan Sepsis Diabetes Type II Hypertension Hyperlipidemia  Hx depression Hx tobacco use COVID negative  Recurrent SBO Hx of gastric bypass/ovarian cyst removal and hernia repair  FEN:  NPO/refused NG/IV fluids DVT:  SCD's ID: Rocephin/Flagyl x 1 yesterday; Zosyn ordered this AM Follow up:  TBD POC: MEADOWS,ANNALEE  [phone]   Plan: I am going to put her on sips and chips immobilizer.  Hopefully she will open up and can progress out of here quickly.  She had the SBP ordered but they did not give the contrast because there was no NG.  Have reordered and she will get it orally.         LOS: 1 day    Cheryll Keisler 08/04/2018 [phone]

## 2018-08-04 NOTE — Progress Notes (Signed)
PHARMACY NOTE -  [facility] has been assisting with dosing of Zosyn for intra-abdominal infection.  Dosage remains stable at 3.375g IV q8 hr and need for further dosage adjustment appears unlikely at present given good renal function  Pharmacy will sign off, following peripherally for culture results or dose adjustments. Please reconsult if a change in clinical status warrants re-evaluation of dosage.  Reuel Boom, PharmD, BCPS [phone] 08/04/2018, 12:27 PM

## 2018-08-04 NOTE — Progress Notes (Signed)
Inpatient Diabetes Program Recommendations  AACE/ADA: New Consensus Statement on Inpatient Glycemic Control (2015)  Target Ranges:  Prepandial:   less than 140 mg/dL      Peak postprandial:   less than 180 mg/dL (1-2 hours)      Critically ill patients:  140 - 180 mg/dL   Lab Results  Component Value Date   GLUCAP 116 (H) 08/04/2018   HGBA1C (H) 03/16/2009    11.0 (NOTE) The ADA recommends the following therapeutic goal for glycemic control related to Hgb A1c measurement: Goal of therapy: <6.5 Hgb A1c  Reference: American Diabetes Association: Clinical Practice Recommendations 2010, Diabetes Care, 2010, 33: (Suppl  1).    Review of Glycemic Control  Diabetes history: DM2 Outpatient Diabetes medications: Bydureon 2 mg once weekly, metformin 500 mg QAM Current orders for Inpatient glycemic control: Novolog 0-15 units Q4H.  HgbA1C - 11% -  Pt to f/u with PCP in [place]. CBGs 247, 178, 116 mg/dL  Inpatient Diabetes Program Recommendations:     Long discussion with pt regarding diabetes control. Pt states she will get back on track with checking blood sugars again. Will f/u with PCP in [place].   Agree with orders. Continue to follow.   Thank you. Lorenda Peck, RD, LDN, CDE Inpatient Diabetes Coordinator [phone]

## 2018-08-04 NOTE — Progress Notes (Signed)
PROGRESS NOTE    Maurilio LovelyMichelle R Gwilliam  ZOX:[id number] DOB: 01/05/1961 DOA: 08/03/2018 PCP: Patient, No Pcp Per   Brief Narrative: Per HPI: 58 y.o. female with medical history significant of gastric bypass surgery, small bowel obstruction, hypertension, hyperlipidemia, DM, depression, presented with nausea vomiting  (x10, nonbloody non-billous), abdominal pain, located in central abdomen,10/10. Has had bowel obstruction since May.   In ER:WBC 18.9, lactic acid of 4.4-> 2.7, negative COVID-19 test, renal function normal, temperature normal, blood pressure 148/94, tachycardia, tachypnea, oxygen sat 92-100% on room air, CT abdomen/pelvis that showed partial small bowel obstruction.   Patient was admitted with general surgery consult for further management.  Subjective: Seen/examined Feels well this am- no more abd pain. No fever, cough, chest pain. NO BM and not passed flatus but voided Wbc 18.9k--> down to 13.1k  Assessment & Plan:   Partial SBO: Patient feels well this morning, abdomen pain is improved but no flatus or bowel movement yet.  Neurosurgery on board appreciate input and discussed this morning-started on sips chips, continue to monitor, continue supportive care with IV hydration.  Patient has already refused NG tube.  ? Sepsis with leukocytosis, lactic acidosis tachycardia and tachypnea- POA: Could be secondary to #1, with reactive leukocytosis, and from associated hypovolemia- r/o infection. leukocytosis improving, vitals are stable lactic, this is resolved to 1.2, procalcitonin elevated at 0.60 continue on Zosyn for now empirically-until further culture data evaluated- Blood culture sent 7/8-Will quickly de-escale/stop antibiotics. she is afebrile, wbc down  18k-->13.1k.   Hypertension: Blood pressure is stable continue PRN hydralazine.  Hyperlipidemia: Holding Lipitor.  Diabetes mellitus  on metformin and exenetide inj at home-with uncontrolled hyperglycemia: 247 -384, got 8 u  insulin so far.  Ordered Hba1c, change insulin to NovoLog 0-15 units every 4 hours Accu-Chek sliding scale, Dm coordinator on consult, appreciate inputs    DVT prophylaxis: SCD, ambulate today Code Status: full Family Communication: plan of care discussed with patient in detail. Was talking ot her children on phone Disposition Plan: Remains inpatient pending clinical improvement. Discussed w Dr Cliffton AstersWhite personally  Consultants:  [facility]  Procedures: None  Microbiology: Blood cx 7/8- sent COVID 19 -neg  Antimicrobials: Anti-infectives (From admission, onward)   Start     Dose/Rate Route Frequency Ordered Stop   08/04/18 0400  piperacillin-tazobactam (ZOSYN) IVPB 3.375 g     3.375 g 12.5 mL/hr over 240 Minutes Intravenous Every 8 hours 08/03/18 2329     08/03/18 2000  cefTRIAXone (ROCEPHIN) 2 g in sodium chloride 0.9 % 100 mL IVPB     2 g 200 mL/hr over 30 Minutes Intravenous  Once 08/03/18 1957 08/03/18 2109   08/03/18 2000  metroNIDAZOLE (FLAGYL) IVPB 500 mg     500 mg 100 mL/hr over 60 Minutes Intravenous  Once 08/03/18 1957 08/03/18 2213       Objective: Vitals:   08/03/18 2230 08/03/18 2300 08/03/18 2350 08/04/18 0506  BP: (!) 148/94 (!) 154/105 (!) 149/97 106/75  Pulse: (!) 102 (!) 105 (!) 105 (!) 106  Resp: 19 19 16 14   Temp:   97.9 F (36.6 C) 98.1 F (36.7 C)  TempSrc:   Oral Oral  SpO2: 92% 96% 97% 97%  Weight:      Height:        Intake/Output Summary (Last 24 hours) at 08/04/2018 0903 Last data filed at 08/04/2018 0600 Gross per 24 hour  Intake 654.67 ml  Output 800 ml  Net -145.33 ml   [employer]   08/03/18  1802  Weight: 81.6 kg   Weight change:   Body mass index is 28.19 kg/m.  Intake/Output from previous day: 07/07 0701 - 07/08 0700 In: 654.7 [I.V.:618.9; IV Piggyback:35.8] Out: 800 [Urine:800] Intake/Output this shift: No intake/output data recorded.  Examination:  General exam: Appears calm and comfortable,NAD HEENT:PERRL,Oral mucosa  moist, Ear/Nose normal on gross exam Respiratory system: Bilateral equal air entry, normal vesicular breath sounds, no wheezes or crackles  Cardiovascular system: S1 & S2 heard,No JVD, murmurs. Gastrointestinal system: Abdomen is  soft, non tender, non distended, BS SLUGGISH Nervous System:Alert and oriented. No focal neurological deficits/moving extremities, sensation intact. Extremities: No edema, no clubbing, distal peripheral pulses palpable. Skin: No rashes, lesions, no icterus MSK: Normal muscle bulk,tone ,power  Medications:  Scheduled Meds: . diatrizoate meglumine-sodium  90 mL Per NG tube Once  . insulin aspart  0-5 Units Subcutaneous QHS  . insulin aspart  0-9 Units Subcutaneous TID WC   Continuous Infusions: . sodium chloride 100 mL/hr at 08/03/18 2348  . piperacillin-tazobactam (ZOSYN)  IV 3.375 g (08/04/18 0308)    Data Reviewed: I have personally reviewed following labs and imaging studies  CBC: Recent Labs  Lab 08/03/18 1840 08/04/18 0020  WBC 18.9* 13.1*  HGB 15.2* 14.2  HCT 46.1* 45.0  MCV 91.7 93.9  PLT 297 203   Basic Metabolic Panel: Recent Labs  Lab 08/03/18 1840 08/04/18 0020  NA 136 137  K 3.5 4.5  CL 101 105  CO2 17* 20*  GLUCOSE 338* 373*  BUN 22* 23*  CREATININE 0.90 0.71  CALCIUM 9.8 8.4*   GFR: Estimated Creatinine Clearance: 85.2 mL/min (by C-G formula based on SCr of 0.71 mg/dL). Liver Function Tests: Recent Labs  Lab 08/03/18 1840  AST 26  ALT 23  ALKPHOS 119  BILITOT 0.2*  PROT 7.8  ALBUMIN 4.6   Recent Labs  Lab 08/03/18 1840  LIPASE 62*   No results for input(s): AMMONIA in the last 168 hours. Coagulation Profile: Recent Labs  Lab 08/04/18 0008  INR 1.0   Cardiac Enzymes: No results for input(s): CKTOTAL, CKMB, CKMBINDEX, TROPONINI in the last 168 hours. BNP (last 3 results) No results for input(s): PROBNP in the last 8760 hours. HbA1C: No results for input(s): HGBA1C in the last 72 hours. CBG: Recent  Labs  Lab 08/03/18 1810 08/04/18 0031 08/04/18 0735  GLUCAP 274* 384* 247*   Lipid Profile: No results for input(s): CHOL, HDL, LDLCALC, TRIG, CHOLHDL, LDLDIRECT in the last 72 hours. Thyroid Function Tests: No results for input(s): TSH, T4TOTAL, FREET4, T3FREE, THYROIDAB in the last 72 hours. Anemia Panel: No results for input(s): VITAMINB12, FOLATE, FERRITIN, TIBC, IRON, RETICCTPCT in the last 72 hours. Sepsis Labs: Recent Labs  Lab 08/03/18 1840 08/03/18 2040 08/04/18 0044 08/04/18 0421  PROCALCITON  --   --  0.60  --   LATICACIDVEN 4.4* 2.7* 1.7 1.2    Recent Results (from the past 240 hour(s))  SARS Coronavirus 2 (CEPHEID - Performed in [facility] hospital lab), Hosp Order     Status: None   Collection Time: 08/03/18  7:32 PM   Specimen: Nasopharyngeal Swab  Result Value Ref Range Status   SARS Coronavirus 2 NEGATIVE NEGATIVE Final    Comment: (NOTE) If result is NEGATIVE SARS-CoV-2 target nucleic acids are NOT DETECTED. The SARS-CoV-2 RNA is generally detectable in upper and lower  respiratory specimens during the acute phase of infection. The lowest  concentration of SARS-CoV-2 viral copies this assay can detect is 250  copies / mL. A negative result does not preclude SARS-CoV-2 infection  and should not be used as the sole basis for treatment or other  patient management decisions.  A negative result may occur with  improper specimen collection / handling, submission of specimen other  than nasopharyngeal swab, presence of viral mutation(s) within the  areas targeted by this assay, and inadequate number of viral copies  (<250 copies / mL). A negative result must be combined with clinical  observations, patient history, and epidemiological information. If result is POSITIVE SARS-CoV-2 target nucleic acids are DETECTED. The SARS-CoV-2 RNA is generally detectable in upper and lower  respiratory specimens dur ing the acute phase of infection.  Positive  results  are indicative of active infection with SARS-CoV-2.  Clinical  correlation with patient history and other diagnostic information is  necessary to determine patient infection status.  Positive results do  not rule out bacterial infection or co-infection with other viruses. If result is PRESUMPTIVE POSTIVE SARS-CoV-2 nucleic acids MAY BE PRESENT.   A presumptive positive result was obtained on the submitted specimen  and confirmed on repeat testing.  While 2019 novel coronavirus  (SARS-CoV-2) nucleic acids may be present in the submitted sample  additional confirmatory testing may be necessary for epidemiological  and / or clinical management purposes  to differentiate between  SARS-CoV-2 and other Sarbecovirus currently known to infect humans.  If clinically indicated additional testing with an alternate test  methodology [phone]([id number]) is advised. The SARS-CoV-2 RNA is generally  detectable in upper and lower respiratory sp ecimens during the acute  phase of infection. The expected result is Negative. Fact Sheet for Patients:  BoilerBrush.com.cyhttps://www.fda.gov/media/136312/download Fact Sheet for Healthcare Providers: https://pope.com/https://www.fda.gov/media/136313/download This test is not yet approved or cleared by the [place] FDA and has been authorized for detection and/or diagnosis of SARS-CoV-2 by FDA under an Emergency Use Authorization (EUA).  This EUA will remain in effect (meaning this test can be used) for the duration of the COVID-19 declaration under Section 564(b)(1) of the Act, 21 U.S.C. section 360bbb-3(b)(1), unless the authorization is terminated or revoked sooner. Performed at [facility], [address] [place], [place] [address]       Radiology Studies: Ct Abdomen Pelvis W Contrast  Result Date: 08/03/2018 CLINICAL DATA:  Mid abdominal pain with frequent voiding beginning this morning. History of a previous bowel obstruction. EXAM: CT ABDOMEN AND PELVIS WITH CONTRAST  TECHNIQUE: Multidetector CT imaging of the abdomen and pelvis was performed using the standard protocol following bolus administration of intravenous contrast. CONTRAST:  100mL OMNIPAQUE IOHEXOL 300 MG/ML  SOLN COMPARISON:  None. FINDINGS: Lower chest: Clear lung bases.  Heart normal size. Hepatobiliary: Small calcification over the dome of the right lobe. Liver normal in size. No mass. Status post cholecystectomy. Mild prominence of the intrahepatic biliary tree. Common bile duct is mildly prominent measuring 7 mm, presumed chronic. Pancreas: Unremarkable. No pancreatic ductal dilatation or surrounding inflammatory changes. Spleen: Normal in size without focal abnormality. Adrenals/Urinary Tract: Adrenal glands are unremarkable. Kidneys are normal, without renal calculi, focal lesion, or hydronephrosis. Bladder is unremarkable. 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 The stomach is mild-to-moderately distended. There is significant small bowel dilation, with small bowel having a diameter of 5.5 cm in the left upper quadrant. The distal small bowel is decompressed. The apparent transition is in the right upper pelvis. There is no small bowel wall thickening or adjacent inflammation. The colon is  mostly decompressed. Appendix not discretely seen, but there is no evidence of appendicitis. No colonic wall thickening or inflammation. Vascular/Lymphatic: Normal caliber aorta. Minor atherosclerotic changes. No other vascular abnormality. No enlarged lymph nodes. Reproductive: Uterus and bilateral adnexa are unremarkable. Other: No abdominal wall hernia.  No ascites. Musculoskeletal: Chronic bilateral pars defects at L5-S1 with a grade 1 anterolisthesis. No acute fractures. No osteoblastic or osteolytic lesions. IMPRESSION: 1. Partial small bowel obstruction, moderate in severity, with the transition point in the right upper pelvis, likely due to adhesions.  2. No bowel wall thickening or adjacent inflammation. No evidence of ischemia. 3. No other acute abnormality. 4. 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 5. Minor aortic atherosclerosis. Electronically Signed   By: Lajean Manes M.D.   On: 08/03/2018 20:55      LOS: 1 day   Time spent: More than 50% of that time was spent in counseling and/or coordination of care.  Antonieta Pert, MD [facility]  08/04/2018, 9:03 AM

## 2018-08-05 ENCOUNTER — Inpatient Hospital Stay (HOSPITAL_COMMUNITY): Payer: Managed Care, Other (non HMO)

## 2018-08-05 LAB — HEMOGLOBIN A1C
Hgb A1c MFr Bld: 7.4 % — ABNORMAL HIGH (ref 4.8–5.6)
Mean Plasma Glucose: 166 mg/dL

## 2018-08-05 LAB — GLUCOSE, CAPILLARY
Glucose-Capillary: 86 mg/dL (ref 70–99)
Glucose-Capillary: 99 mg/dL (ref 70–99)
Glucose-Capillary: 124 mg/dL — ABNORMAL HIGH (ref 70–99)
Glucose-Capillary: 115 mg/dL — ABNORMAL HIGH (ref 70–99)
Glucose-Capillary: 131 mg/dL — ABNORMAL HIGH (ref 70–99)

## 2018-08-05 MED ORDER — ATORVASTATIN CALCIUM 10 MG PO TABS
10.0000 mg | ORAL_TABLET | Freq: Every evening | ORAL | Status: DC
  Administered 2018-08-05: 10 mg via ORAL
  Filled 2018-08-05: qty 1

## 2018-08-05 MED ORDER — LISINOPRIL-HYDROCHLOROTHIAZIDE 20-12.5 MG PO TABS: 1.0000 | ORAL_TABLET | Freq: Every day | ORAL | Status: DC

## 2018-08-05 MED ORDER — LISINOPRIL 20 MG PO TABS
20.0000 mg | ORAL_TABLET | Freq: Every day | ORAL | Status: DC
  Filled 2018-08-05: qty 1

## 2018-08-05 MED ORDER — HYDROCHLOROTHIAZIDE 12.5 MG PO CAPS
12.5000 mg | ORAL_CAPSULE | Freq: Every day | ORAL | Status: DC
  Administered 2018-08-05: 12.5 mg via ORAL
  Filled 2018-08-05: qty 1

## 2018-08-05 NOTE — Progress Notes (Signed)
PROGRESS NOTE    OPIE LIAO  VWU:[id number] DOB: July 06, 1960 DOA: 08/03/2018 PCP: Patient, No Pcp Per    Brief Narrative:  Per HPI: 58 y.o.femalewith medical history significant ofgastric bypass surgery, small bowel obstruction, hypertension, hyperlipidemia, DM,depression, presented with nausea vomiting  (x10, nonbloody non-billous), abdominal pain, located in central abdomen,10/10. Has had bowel obstruction since May.   In ER:WBC 18.9, lactic acid of 4.4-> 2.7, negative COVID-19 test, renal function normal, temperature normal, blood pressure 148/94, tachycardia, tachypnea, oxygen sat 92-100% on room air, CT abdomen/pelvis that showed partial small bowel obstruction.  Patient was admitted with general surgery consult for further management.   Assessment & Plan:   Principal Problem:   SBO (small bowel obstruction) (HCC) Active Problems:   Hypertension   Hyperlipidemia   Diabetes mellitus without complication (HCC)   Sepsis (HCC)  Partial small bowel obstruction: With clinical improvement.  Patient was able to tolerate clear liquid diet.  Advancing to full liquid diet.  Advance and ambulate in the hallway.  Followed by surgery.  Recommending interval follow-up with bariatric surgery at her hometown in [place]. Repeat KUB today with contrast in the colon.  Sirs: With leukocytosis lactic acidosis tachycardia and tachypnea which is probably secondary to #1.  Patient with no evidence of localizing infection.  Will discontinue antibiotics.  Hypertension: Fairly stable.  Was on IV antihypertensives.  Will discontinue and resume her home medications.  Type 2 diabetes on metformin at home: Currently on insulin.  Will resume home medicine on discharge.   DVT prophylaxis: SCDs Code Status: Full code Family Communication: None Disposition Plan: Anticipate discharge home tomorrow   Consultants:   General surgery  Procedures:   None  Antimicrobials:   Zosyn,  08/03/2018-08/05/2018   Subjective: Patient seen and examined.  She was able to have clear liquid diet.  She had 1 bowel movement last night.  Denies any abdominal pain.  Denies any nausea vomiting.  Objective: Vitals:   08/04/18 0506 08/04/18 1317 08/04/18 2033 08/05/18 0413  BP: 106/75 114/80 128/80 108/72  Pulse: (!) 106 (!) 107 96 84  Resp: 14 16 16 16   Temp: 98.1 F (36.7 C) 98.5 F (36.9 C) 98.5 F (36.9 C) 98 F (36.7 C)  TempSrc: Oral Oral Oral Oral  SpO2: 97% 99% 100% 100%  Weight:      Height:        Intake/Output Summary (Last 24 hours) at 08/05/2018 1005 Last data filed at 08/05/2018 0600 Gross per 24 hour  Intake 210 ml  Output 600 ml  Net -390 ml   Filed Weights   08/03/18 1802  Weight: 81.6 kg    Examination:  General exam: Appears calm and comfortable  Respiratory system: Clear to auscultation. Respiratory effort normal. Cardiovascular system: S1 & S2 heard, RRR. No JVD, murmurs, rubs, gallops or clicks. No pedal edema. Gastrointestinal system: Abdomen is nondistended, soft and nontender. No organomegaly or masses felt. Normal bowel sounds heard. Central nervous system: Alert and oriented. No focal neurological deficits. Extremities: Symmetric 5 x 5 power. Skin: No rashes, lesions or ulcers Psychiatry: Judgement and insight appear normal. Mood & affect appropriate.     Data Reviewed: I have personally reviewed following labs and imaging studies  CBC: Recent Labs  Lab 08/03/18 1840 08/04/18 0020  WBC 18.9* 13.1*  HGB 15.2* 14.2  HCT 46.1* 45.0  MCV 91.7 93.9  PLT 297 203   Basic Metabolic Panel: Recent Labs  Lab 08/03/18 1840 08/04/18 0020  NA 136 137  K 3.5 4.5  CL 101 105  CO2 17* 20*  GLUCOSE 338* 373*  BUN 22* 23*  CREATININE 0.90 0.71  CALCIUM 9.8 8.4*   GFR: Estimated Creatinine Clearance: 85.2 mL/min (by C-G formula based on SCr of 0.71 mg/dL). Liver Function Tests: Recent Labs  Lab 08/03/18 1840  AST 26  ALT 23  ALKPHOS  119  BILITOT 0.2*  PROT 7.8  ALBUMIN 4.6   Recent Labs  Lab 08/03/18 1840  LIPASE 62*   No results for input(s): AMMONIA in the last 168 hours. Coagulation Profile: Recent Labs  Lab 08/04/18 0008  INR 1.0   Cardiac Enzymes: No results for input(s): CKTOTAL, CKMB, CKMBINDEX, TROPONINI in the last 168 hours. BNP (last 3 results) No results for input(s): PROBNP in the last 8760 hours. HbA1C: Recent Labs    08/04/18 0020  HGBA1C 7.4*   CBG: Recent Labs  Lab 08/04/18 1629 08/04/18 2015 08/04/18 2337 08/05/18 0410 08/05/18 0735  GLUCAP 116* 128* 107* 86 99   Lipid Profile: No results for input(s): CHOL, HDL, LDLCALC, TRIG, CHOLHDL, LDLDIRECT in the last 72 hours. Thyroid Function Tests: No results for input(s): TSH, T4TOTAL, FREET4, T3FREE, THYROIDAB in the last 72 hours. Anemia Panel: No results for input(s): VITAMINB12, FOLATE, FERRITIN, TIBC, IRON, RETICCTPCT in the last 72 hours. Sepsis Labs: Recent Labs  Lab 08/03/18 1840 08/03/18 2040 08/04/18 0044 08/04/18 0421  PROCALCITON  --   --  0.60  --   LATICACIDVEN 4.4* 2.7* 1.7 1.2    Recent Results (from the past 240 hour(s))  SARS Coronavirus 2 (CEPHEID - Performed in [facility] hospital lab), Hosp Order     Status: None   Collection Time: 08/03/18  7:32 PM   Specimen: Nasopharyngeal Swab  Result Value Ref Range Status   SARS Coronavirus 2 NEGATIVE NEGATIVE Final    Comment: (NOTE) If result is NEGATIVE SARS-CoV-2 target nucleic acids are NOT DETECTED. The SARS-CoV-2 RNA is generally detectable in upper and lower  respiratory specimens during the acute phase of infection. The lowest  concentration of SARS-CoV-2 viral copies this assay can detect is 250  copies / mL. A negative result does not preclude SARS-CoV-2 infection  and should not be used as the sole basis for treatment or other  patient management decisions.  A negative result may occur with  improper specimen collection / handling, submission  of specimen other  than nasopharyngeal swab, presence of viral mutation(s) within the  areas targeted by this assay, and inadequate number of viral copies  (<250 copies / mL). A negative result must be combined with clinical  observations, patient history, and epidemiological information. If result is POSITIVE SARS-CoV-2 target nucleic acids are DETECTED. The SARS-CoV-2 RNA is generally detectable in upper and lower  respiratory specimens dur ing the acute phase of infection.  Positive  results are indicative of active infection with SARS-CoV-2.  Clinical  correlation with patient history and other diagnostic information is  necessary to determine patient infection status.  Positive results do  not rule out bacterial infection or co-infection with other viruses. If result is PRESUMPTIVE POSTIVE SARS-CoV-2 nucleic acids MAY BE PRESENT.   A presumptive positive result was obtained on the submitted specimen  and confirmed on repeat testing.  While 2019 novel coronavirus  (SARS-CoV-2) nucleic acids may be present in the submitted sample  additional confirmatory testing may be necessary for epidemiological  and / or clinical management purposes  to differentiate between  SARS-CoV-2 and other Sarbecovirus currently known  to infect humans.  If clinically indicated additional testing with an alternate test  methodology [phone]) is advised. The SARS-CoV-2 RNA is generally  detectable in upper and lower respiratory sp ecimens during the acute  phase of infection. The expected result is Negative. Fact Sheet for Patients:  BoilerBrush.com.cy Fact Sheet for Healthcare Providers: https://pope.com/ This test is not yet approved or cleared by the [place] FDA and has been authorized for detection and/or diagnosis of SARS-CoV-2 by FDA under an Emergency Use Authorization (EUA).  This EUA will remain in effect (meaning this test can be used) for  the duration of the COVID-19 declaration under Section 564(b)(1) of the Act, 21 U.S.C. section 360bbb-3(b)(1), unless the authorization is terminated or revoked sooner. Performed at [facility], [address] [place], [place] [address]          Radiology Studies: Dg Abd 1 View  Result Date: 08/05/2018 CLINICAL DATA:  Follow-up small-bowel obstruction. EXAM: ABDOMEN - 1 VIEW COMPARISON:  Abdomen series 08/04/2018.  CT 08/03/2018. FINDINGS: Surgical clips and sutures are noted over the abdomen. Contrast is noted throughout the colon and rectum. Mild dilatation of small bowel. Small-bowel distention has improved from prior exam. No free air. No acute bony abnormality. IMPRESSION: Oral contrast noted throughout the colon and rectum. Mild distention of small bowel. Small-bowel distention has improved from prior exam. Electronically Signed   By: Maisie Fus  Register   On: 08/05/2018 09:14   Ct Abdomen Pelvis W Contrast  Result Date: 08/03/2018 CLINICAL DATA:  Mid abdominal pain with frequent voiding beginning this morning. History of a previous bowel obstruction. EXAM: CT ABDOMEN AND PELVIS WITH CONTRAST TECHNIQUE: Multidetector CT imaging of the abdomen and pelvis was performed using the standard protocol following bolus administration of intravenous contrast. CONTRAST:  OMNIPAQUE IOHEXOL 300 MG/ML  SOLN COMPARISON:  None. FINDINGS: Lower chest: Clear lung bases.  Heart normal size. Hepatobiliary: Small calcification over the dome of the right lobe. Liver normal in size. No mass. Status post cholecystectomy. Mild prominence of the intrahepatic biliary tree. Common bile duct is mildly prominent measuring 7 mm, presumed chronic. Pancreas: Unremarkable. No pancreatic ductal dilatation or surrounding inflammatory changes. Spleen: Normal in size without focal abnormality. Adrenals/Urinary Tract: Adrenal glands are unremarkable. Kidneys are normal, without renal calculi, focal lesion,  or hydronephrosis. Bladder is unremarkable. 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 The stomach is mild-to-moderately distended. There is significant small bowel dilation, with small bowel having a diameter of 5.5 cm in the left upper quadrant. The distal small bowel is decompressed. The apparent transition is in the right upper pelvis. There is no small bowel wall thickening or adjacent inflammation. The colon is mostly decompressed. Appendix not discretely seen, but there is no evidence of appendicitis. No colonic wall thickening or inflammation. Vascular/Lymphatic: Normal caliber aorta. Minor atherosclerotic changes. No other vascular abnormality. No enlarged lymph nodes. Reproductive: Uterus and bilateral adnexa are unremarkable. Other: No abdominal wall hernia.  No ascites. Musculoskeletal: Chronic bilateral pars defects at L5-S1 with a grade 1 anterolisthesis. No acute fractures. No osteoblastic or osteolytic lesions. IMPRESSION: 1. Partial small bowel obstruction, moderate in severity, with the transition point in the right upper pelvis, likely due to adhesions. 2. No bowel wall thickening or adjacent inflammation. No evidence of ischemia. 3. No other acute abnormality. 4. 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 5. Minor aortic atherosclerosis. Electronically Signed   By: Amie Portland  M.D.   On: 08/03/2018 20:55   Dg Abd Portable 1v-small Bowel Obstruction Protocol-initial, 8 Hr Delay  Result Date: 08/04/2018 CLINICAL DATA:  Small-bowel obstruction, 8 hour delay EXAM: PORTABLE ABDOMEN - 1 VIEW COMPARISON:  Portable exam 1637 hours compared to 08/03/2018 CT abdomen and pelvis FINDINGS: Some of the administered contrast is present within the colon. Marked distention of small bowel loops particularly LEFT upper quadrant where a small bowel loop measures 6.8 cm diameter. No definite bowel wall thickening.  Bones severely demineralized. Surgical clips RIGHT upper quadrant consistent with cholecystectomy. IMPRESSION: Persistent small bowel obstruction, with some of the oral contrast present in the colon indicating partial obstruction. Electronically Signed   By: Ulyses Southward M.D.   On: 08/04/2018 17:16        Scheduled Meds: . diatrizoate meglumine-sodium  90 mL Per NG tube Once  . insulin aspart  0-15 Units Subcutaneous Q4H   Continuous Infusions: . sodium chloride 100 mL/hr at 08/03/18 2348  . piperacillin-tazobactam (ZOSYN)  IV 3.375 g (08/05/18 0543)     LOS: 2 days    Time spent: 25 minutes    Dorcas Carrow, MD [facility] Pager [phone]  If 7PM-7AM, please contact night-coverage [email or web address] Password [email or web address] 08/05/2018, 10:05 AM

## 2018-08-05 NOTE — Progress Notes (Signed)
    ZL:[id number] pain  Subjective: She feels good, tolerating clears this AM, having a second BM now.    Objective: Vital signs in last 24 hours: Temp:  [98 F (36.7 C)-98.5 F (36.9 C)] 98 F (36.7 C) (07/09 0413) Pulse Rate:  [84-107] 84 (07/09 0413) Resp:  [16] 16 (07/09 0413) BP: (108-128)/(72-80) 108/72 (07/09 0413) SpO2:  [99 %-100 %] 100 % (07/09 0413) Last BM Date: 08/04/18 60 PO recorded yesterday 610 IV Urine 600 Stool x 1 Afebrile, VSS Glucose improved No other labs Film shows contrast in colon Intake/Output from previous day: 07/08 0701 - 07/09 0700 In: 610 [P.O.:60; I.V.:400; IV Piggyback:150] Out: 600 [Urine:600] Intake/Output this shift: No intake/output data recorded.  General appearance: alert, cooperative and no distress GI: soft, non-tender; bowel sounds normal; no masses,  no organomegaly  Lab Results:  Recent Labs    08/03/18 1840 08/04/18 0020  WBC 18.9* 13.1*  HGB 15.2* 14.2  HCT 46.1* 45.0  PLT 297 203    BMET Recent Labs    08/03/18 1840 08/04/18 0020  NA 136 137  K 3.5 4.5  CL 101 105  CO2 17* 20*  GLUCOSE 338* 373*  BUN 22* 23*  CREATININE 0.90 0.71  CALCIUM 9.8 8.4*   PT/INR Recent Labs    08/04/18 0008  LABPROT 13.0  INR 1.0    Recent Labs  Lab 08/03/18 1840  AST 26  ALT 23  ALKPHOS 119  BILITOT 0.2*  PROT 7.8  ALBUMIN 4.6     Lipase     Component Value Date/Time   LIPASE 62 (H) 08/03/2018 1840     Medications: . diatrizoate meglumine-sodium  90 mL Per NG tube Once  . insulin aspart  0-15 Units Subcutaneous Q4H   . sodium chloride 100 mL/hr at 08/03/18 2348  . piperacillin-tazobactam (ZOSYN)  IV 3.375 g (08/05/18 0543)    Assessment/Plan Sepsis Diabetes Type II Hypertension Hyperlipidemia  Hx depression Hx tobacco use COVID negative  Recurrent SBO Hx of gastric bypass/ovarian cyst removal and hernia repair  FEN:  Clear liquids started this AM/refused NG/IV fluids DVT:  SCD's -  she can have Lovenox/heparin from our standpoint ID: Rocephin/Flagyl x 1 yesterday; Zosyn 7/8 >> day 2 Follow up:  TBD POC: MEADOWS,ANNALEE  [phone]   Plan:  Advance her diet, if she does well on fulls I think she can go home.  She may need to see Bariatric surgeon near home and laparoscopy for recurrent SBO in the future.          LOS: 2 days    Cervando Durnin 08/05/2018 [phone]

## 2018-08-05 NOTE — Plan of Care (Signed)
Patient sitting edge of bed this morning. No complaints of pain or nausea. Has been ambulating in room this morning; up to bathroom. No needs expressed at this time. Will continue to monitor.

## 2018-08-06 LAB — GLUCOSE, CAPILLARY
Glucose-Capillary: 106 mg/dL — ABNORMAL HIGH (ref 70–99)
Glucose-Capillary: 83 mg/dL (ref 70–99)
Glucose-Capillary: 95 mg/dL (ref 70–99)

## 2018-08-06 NOTE — Progress Notes (Signed)
Discharge instructions discussed with patient, verbalized agreement and understanding, 

## 2018-08-06 NOTE — Discharge Summary (Signed)
Physician Discharge Summary  Ashlee Thomas ZOX:[id number] DOB: Jun 12, 1960 DOA: 08/03/2018  PCP: Patient, No Pcp Per  Admit date: 08/03/2018 Discharge date: 08/06/2018  Admitted From: Home Disposition: Home.  Recommendations for Outpatient Follow-up:  1. Follow-up with bariatric surgery as scheduled.  Home Health: Not applicable Equipment/Devices: Not applicable  Discharge Condition: Stable CODE STATUS: Full code Diet recommendation: Full liquid and soft diet  Discharge summary:  Patient is a 58 year old female with history of gastric bypass surgery, small bowel obstructions, hypertension, hyperlipidemia, type 2 diabetes, depression who presented to the emergency room with nausea vomiting abdominal pain and found to have partial small bowel obstruction.  On presentation, WBC 18.9, lactic acid 4.4, negative COVID-19 test and normal renal function test.  CT scan abdomen pelvis showed partial small bowel obstruction.  She was admitted to the hospital treated with IV fluids, n.p.o. with conservative management.  Seen and followed by surgery.  Did good clinical improvement and currently with normal bowel function and asymptomatic.  Patient is having these recurrent symptoms since May, she had 3 or 4 episodes this summer.  As per surgical recommendation, patient may benefit with elective surgical evaluation and outpatient referral to bariatric surgery has been done.  Patient is asymptomatic today, she is going home.  She will resume all her home medications.  Was advised to stay on full liquid and soft diet and to avoid constipation.  Discharge Diagnoses:  Principal Problem:   SBO (small bowel obstruction) (HCC) Active Problems:   Hypertension   Hyperlipidemia   Diabetes mellitus without complication (HCC)   Sepsis [facility])    Discharge Instructions  Discharge Instructions    Call MD for:  persistant nausea and vomiting   Complete by: As directed    Call MD for:  severe uncontrolled  pain   Complete by: As directed    Diet Carb Modified   Complete by: As directed    Diet full liquid   Complete by: As directed    And soft diet   Increase activity slowly   Complete by: As directed      Allergies as of 08/06/2018   No Known Allergies     Medication List    TAKE these medications   atorvastatin 10 MG tablet Commonly known as: LIPITOR Take 10 mg by mouth every evening.   Bydureon BCise 2 MG/0.85ML Auij Generic drug: Exenatide ER Inject 2 mg into the skin once a week.   lisinopril-hydrochlorothiazide 20-12.5 MG tablet Commonly known as: ZESTORETIC Take 1 tablet by mouth daily.   metFORMIN 500 MG 24 hr tablet Commonly known as: GLUCOPHAGE-XR Take 500 mg by mouth daily with breakfast.   ONE-TABLET-DAILY/IRON PO Take 1 tablet by mouth daily.   vitamin E 400 UNIT capsule Take 400 Units by mouth daily.   Womens Multi Vitamin & Mineral Tabs Take 1 tablet by mouth daily.      Follow-up Information    Gaynelle Adu, MD Follow up on 08/12/2018.   Specialty: General Surgery Why: Your appointment is at 8:45 AM.  Be at the office 30 minutes early for check in.  Bring photo ID and insurance information.   Contact information: [address] [place] [address] [phone]          No Known Allergies  Consultations:  General surgery   Procedures/Studies: Dg Abd 1 View  Result Date: 08/05/2018 CLINICAL DATA:  Follow-up small-bowel obstruction. EXAM: ABDOMEN - 1 VIEW COMPARISON:  Abdomen series 08/04/2018.  CT 08/03/2018. FINDINGS:  Surgical clips and sutures are noted over the abdomen. Contrast is noted throughout the colon and rectum. Mild dilatation of small bowel. Small-bowel distention has improved from prior exam. No free air. No acute bony abnormality. IMPRESSION: Oral contrast noted throughout the colon and rectum. Mild distention of small bowel. Small-bowel distention has improved from prior exam. Electronically Signed   By: Maisie Fus   Register   On: 08/05/2018 09:14   Ct Abdomen Pelvis W Contrast  Result Date: 08/03/2018 CLINICAL DATA:  Mid abdominal pain with frequent voiding beginning this morning. History of a previous bowel obstruction. EXAM: CT ABDOMEN AND PELVIS WITH CONTRAST TECHNIQUE: Multidetector CT imaging of the abdomen and pelvis was performed using the standard protocol following bolus administration of intravenous contrast. CONTRAST:  OMNIPAQUE IOHEXOL 300 MG/ML  SOLN COMPARISON:  None. FINDINGS: Lower chest: Clear lung bases.  Heart normal size. Hepatobiliary: Small calcification over the dome of the right lobe. Liver normal in size. No mass. Status post cholecystectomy. Mild prominence of the intrahepatic biliary tree. Common bile duct is mildly prominent measuring 7 mm, presumed chronic. Pancreas: Unremarkable. No pancreatic ductal dilatation or surrounding inflammatory changes. Spleen: Normal in size without focal abnormality. Adrenals/Urinary Tract: Adrenal glands are unremarkable. Kidneys are normal, without renal calculi, focal lesion, or hydronephrosis. Bladder is unremarkable. 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 The stomach is mild-to-moderately distended. There is significant small bowel dilation, with small bowel having a diameter of 5.5 cm in the left upper quadrant. The distal small bowel is decompressed. The apparent transition is in the right upper pelvis. There is no small bowel wall thickening or adjacent inflammation. The colon is mostly decompressed. Appendix not discretely seen, but there is no evidence of appendicitis. No colonic wall thickening or inflammation. Vascular/Lymphatic: Normal caliber aorta. Minor atherosclerotic changes. No other vascular abnormality. No enlarged lymph nodes. Reproductive: Uterus and bilateral adnexa are unremarkable. Other: No abdominal wall hernia.  No ascites. Musculoskeletal: Chronic bilateral pars  defects at L5-S1 with a grade 1 anterolisthesis. No acute fractures. No osteoblastic or osteolytic lesions. IMPRESSION: 1. Partial small bowel obstruction, moderate in severity, with the transition point in the right upper pelvis, likely due to adhesions. 2. No bowel wall thickening or adjacent inflammation. No evidence of ischemia. 3. No other acute abnormality. 4. 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 5. Minor aortic atherosclerosis. Electronically Signed   By: Amie Portland M.D.   On: 08/03/2018 20:55   Dg Abd Portable 1v-small Bowel Obstruction Protocol-initial, 8 Hr Delay  Result Date: 08/04/2018 CLINICAL DATA:  Small-bowel obstruction, 8 hour delay EXAM: PORTABLE ABDOMEN - 1 VIEW COMPARISON:  Portable exam 1637 hours compared to 08/03/2018 CT abdomen and pelvis FINDINGS: Some of the administered contrast is present within the colon. Marked distention of small bowel loops particularly LEFT upper quadrant where a small bowel loop measures 6.8 cm diameter. No definite bowel wall thickening. Bones severely demineralized. Surgical clips RIGHT upper quadrant consistent with cholecystectomy. IMPRESSION: Persistent small bowel obstruction, with some of the oral contrast present in the colon indicating partial obstruction. Electronically Signed   By: Ulyses Southward M.D.   On: 08/04/2018 17:16      Subjective: Patient seen and examined.  No overnight events.  She was able to eat soft diet.  Had a bowel movement last night.  No abdominal pain.   Discharge Exam: Vitals:   08/05/18 2138 08/06/18 0445  BP: 119/78 101/70  Pulse:  81 73  Resp: 16 16  Temp: 97.7 F (36.5 C) 97.6 F (36.4 C)  SpO2: 98% 96%   Vitals:   08/05/18 0413 08/05/18 1315 08/05/18 2138 08/06/18 0445  BP: 108/72 125/84 119/78 101/70  Pulse: 84 94 81 73  Resp: 16 15 16 16   Temp: 98 F (36.7 C) 97.7 F (36.5 C) 97.7 F (36.5 C) 97.6 F (36.4 C)  TempSrc: Oral Oral Oral Oral  SpO2: 100%  100% 98% 96%  Weight:      Height:        General: Pt is alert, awake, not in acute distress Cardiovascular: RRR, S1/S2 +, no rubs, no gallops Respiratory: CTA bilaterally, no wheezing, no rhonchi Abdominal: Soft, NT, ND, bowel sounds + Extremities: no edema, no cyanosis    The results of significant diagnostics from this hospitalization (including imaging, microbiology, ancillary and laboratory) are listed below for reference.     Microbiology: Recent Results (from the past 240 hour(s))  SARS Coronavirus 2 (CEPHEID - Performed in [facility] hospital lab), Hosp Order     Status: None   Collection Time: 08/03/18  7:32 PM   Specimen: Nasopharyngeal Swab  Result Value Ref Range Status   SARS Coronavirus 2 NEGATIVE NEGATIVE Final    Comment: (NOTE) If result is NEGATIVE SARS-CoV-2 target nucleic acids are NOT DETECTED. The SARS-CoV-2 RNA is generally detectable in upper and lower  respiratory specimens during the acute phase of infection. The lowest  concentration of SARS-CoV-2 viral copies this assay can detect is 250  copies / mL. A negative result does not preclude SARS-CoV-2 infection  and should not be used as the sole basis for treatment or other  patient management decisions.  A negative result may occur with  improper specimen collection / handling, submission of specimen other  than nasopharyngeal swab, presence of viral mutation(s) within the  areas targeted by this assay, and inadequate number of viral copies  (<250 copies / mL). A negative result must be combined with clinical  observations, patient history, and epidemiological information. If result is POSITIVE SARS-CoV-2 target nucleic acids are DETECTED. The SARS-CoV-2 RNA is generally detectable in upper and lower  respiratory specimens dur ing the acute phase of infection.  Positive  results are indicative of active infection with SARS-CoV-2.  Clinical  correlation with patient history and other diagnostic  information is  necessary to determine patient infection status.  Positive results do  not rule out bacterial infection or co-infection with other viruses. If result is PRESUMPTIVE POSTIVE SARS-CoV-2 nucleic acids MAY BE PRESENT.   A presumptive positive result was obtained on the submitted specimen  and confirmed on repeat testing.  While 2019 novel coronavirus  (SARS-CoV-2) nucleic acids may be present in the submitted sample  additional confirmatory testing may be necessary for epidemiological  and / or clinical management purposes  to differentiate between  SARS-CoV-2 and other Sarbecovirus currently known to infect humans.  If clinically indicated additional testing with an alternate test  methodology [phone]) is advised. The SARS-CoV-2 RNA is generally  detectable in upper and lower respiratory sp ecimens during the acute  phase of infection. The expected result is Negative. Fact Sheet for Patients:  BoilerBrush.com.cy Fact Sheet for Healthcare Providers: https://pope.com/ This test is not yet approved or cleared by the [place] FDA and has been authorized for detection and/or diagnosis of SARS-CoV-2 by FDA under an Emergency Use Authorization (EUA).  This EUA will remain in effect (meaning this test can be used)  for the duration of the COVID-19 declaration under Section 564(b)(1) of the Act, 21 U.S.C. section 360bbb-3(b)(1), unless the authorization is terminated or revoked sooner. Performed at [facility], [address], [place], [place] [address]   Culture, blood (x 2)     Status: None (Preliminary result)   Collection Time: 08/04/18 12:44 AM   Specimen: BLOOD LEFT HAND  Result Value Ref Range Status   Specimen Description   Final    BLOOD LEFT HAND Performed at [facility], [address] [place], [place] [address]    Special Requests   Final    BOTTLES DRAWN AEROBIC ONLY  Blood Culture adequate volume Performed at [facility], [address], [place], [place] [address]    Culture   Final    NO GROWTH 1 DAY Performed at [facility], [address] [place], [place] [address]    Report Status PENDING  Incomplete  Culture, blood (x 2)     Status: None (Preliminary result)   Collection Time: 08/04/18 12:44 AM   Specimen: BLOOD RIGHT HAND  Result Value Ref Range Status   Specimen Description   Final    BLOOD RIGHT HAND Performed at [facility], [address], [place], [place] [address]    Special Requests   Final    BOTTLES DRAWN AEROBIC AND ANAEROBIC Blood Culture adequate volume Performed at [facility], [address], [place], [place] [address]    Culture   Final    NO GROWTH 1 DAY Performed at [facility], [address], [place], [place] [address]    Report Status PENDING  Incomplete     Labs: BNP (last 3 results) No results for input(s): BNP in the last 8760 hours. Basic Metabolic Panel: Recent Labs  Lab 08/03/18 1840 08/04/18 0020  NA 136 137  K 3.5 4.5  CL 101 105  CO2 17* 20*  GLUCOSE 338* 373*  BUN 22* 23*  CREATININE 0.90 0.71  CALCIUM 9.8 8.4*   Liver Function Tests: Recent Labs  Lab 08/03/18 1840  AST 26  ALT 23  ALKPHOS 119  BILITOT 0.2*  PROT 7.8  ALBUMIN 4.6   Recent Labs  Lab 08/03/18 1840  LIPASE 62*   No results for input(s): AMMONIA in the last 168 hours. CBC: Recent Labs  Lab 08/03/18 1840 08/04/18 0020  WBC 18.9* 13.1*  HGB 15.2* 14.2  HCT 46.1* 45.0  MCV 91.7 93.9  PLT 297 203   Cardiac Enzymes: No results for input(s): CKTOTAL, CKMB, CKMBINDEX, TROPONINI in the last 168 hours. BNP: Invalid input(s): POCBNP CBG: Recent Labs  Lab 08/05/18 1611 08/05/18 2006 08/06/18 0011 08/06/18 0442 08/06/18 0731  GLUCAP 115* 131* 83 95 106*   D-Dimer No results for input(s): DDIMER in the last 72 hours. Hgb A1c Recent  Labs    08/04/18 0020  HGBA1C 7.4*   Lipid Profile No results for input(s): CHOL, HDL, LDLCALC, TRIG, CHOLHDL, LDLDIRECT in the last 72 hours. Thyroid function studies No results for input(s): TSH, T4TOTAL, T3FREE, THYROIDAB in the last 72 hours.  Invalid input(s): FREET3 Anemia work up No results for input(s): VITAMINB12, FOLATE, FERRITIN, TIBC, IRON, RETICCTPCT in the last 72 hours. Urinalysis    Component Value Date/Time   COLORURINE YELLOW 08/03/2018 1808   APPEARANCEUR CLEAR 08/03/2018 1808   LABSPEC >1.046 (H) 08/03/2018 1808   PHURINE 5.0 08/03/2018 1808   GLUCOSEU >=500 (A) 08/03/2018 1808  HGBUR SMALL (A) 08/03/2018 1808   BILIRUBINUR NEGATIVE 08/03/2018 1808   KETONESUR 20 (A) 08/03/2018 1808   PROTEINUR NEGATIVE 08/03/2018 1808   NITRITE NEGATIVE 08/03/2018 1808   LEUKOCYTESUR NEGATIVE 08/03/2018 1808   Sepsis Labs Invalid input(s): PROCALCITONIN,  WBC,  LACTICIDVEN Microbiology Recent Results (from the past 240 hour(s))  SARS Coronavirus 2 (CEPHEID - Performed in [facility] hospital lab), Hosp Order     Status: None   Collection Time: 08/03/18  7:32 PM   Specimen: Nasopharyngeal Swab  Result Value Ref Range Status   SARS Coronavirus 2 NEGATIVE NEGATIVE Final    Comment: (NOTE) If result is NEGATIVE SARS-CoV-2 target nucleic acids are NOT DETECTED. The SARS-CoV-2 RNA is generally detectable in upper and lower  respiratory specimens during the acute phase of infection. The lowest  concentration of SARS-CoV-2 viral copies this assay can detect is 250  copies / mL. A negative result does not preclude SARS-CoV-2 infection  and should not be used as the sole basis for treatment or other  patient management decisions.  A negative result may occur with  improper specimen collection / handling, submission of specimen other  than nasopharyngeal swab, presence of viral mutation(s) within the  areas targeted by this assay, and inadequate number of viral copies   (<250 copies / mL). A negative result must be combined with clinical  observations, patient history, and epidemiological information. If result is POSITIVE SARS-CoV-2 target nucleic acids are DETECTED. The SARS-CoV-2 RNA is generally detectable in upper and lower  respiratory specimens dur ing the acute phase of infection.  Positive  results are indicative of active infection with SARS-CoV-2.  Clinical  correlation with patient history and other diagnostic information is  necessary to determine patient infection status.  Positive results do  not rule out bacterial infection or co-infection with other viruses. If result is PRESUMPTIVE POSTIVE SARS-CoV-2 nucleic acids MAY BE PRESENT.   A presumptive positive result was obtained on the submitted specimen  and confirmed on repeat testing.  While 2019 novel coronavirus  (SARS-CoV-2) nucleic acids may be present in the submitted sample  additional confirmatory testing may be necessary for epidemiological  and / or clinical management purposes  to differentiate between  SARS-CoV-2 and other Sarbecovirus currently known to infect humans.  If clinically indicated additional testing with an alternate test  methodology [phone]) is advised. The SARS-CoV-2 RNA is generally  detectable in upper and lower respiratory sp ecimens during the acute  phase of infection. The expected result is Negative. Fact Sheet for Patients:  BoilerBrush.com.cy Fact Sheet for Healthcare Providers: https://pope.com/ This test is not yet approved or cleared by the [place] FDA and has been authorized for detection and/or diagnosis of SARS-CoV-2 by FDA under an Emergency Use Authorization (EUA).  This EUA will remain in effect (meaning this test can be used) for the duration of the COVID-19 declaration under Section 564(b)(1) of the Act, 21 U.S.C. section 360bbb-3(b)(1), unless the authorization is terminated  or revoked sooner. Performed at [facility], [address] [place], [place] [address]   Culture, blood (x 2)     Status: None (Preliminary result)   Collection Time: 08/04/18 12:44 AM   Specimen: BLOOD LEFT HAND  Result Value Ref Range Status   Specimen Description   Final    BLOOD LEFT HAND Performed at [facility], [address] [place], [place] [address]    Special Requests   Final    BOTTLES DRAWN AEROBIC  ONLY Blood Culture adequate volume Performed at [facility], [address] [place], [place] [address]    Culture   Final    NO GROWTH 1 DAY Performed at [facility], [address] [place], [place] [address]    Report Status PENDING  Incomplete  Culture, blood (x 2)     Status: None (Preliminary result)   Collection Time: 08/04/18 12:44 AM   Specimen: BLOOD RIGHT HAND  Result Value Ref Range Status   Specimen Description   Final    BLOOD RIGHT HAND Performed at [facility], [address], [place], [place] [address]    Special Requests   Final    BOTTLES DRAWN AEROBIC AND ANAEROBIC Blood Culture adequate volume Performed at [facility], [address] [place], [place] [address]    Culture   Final    NO GROWTH 1 DAY Performed at [facility], [address] [place], [place] [address]    Report Status PENDING  Incomplete     Time coordinating discharge:  25 minutes  SIGNED:   Dorcas Carrow, MD  [facility] 08/06/2018, 8:45 AM Pager [phone]

## 2018-08-06 NOTE — Progress Notes (Signed)
    CC:  Abdominal pain  Subjective: Feels better and ready to go.  Wants to have surgery here rather than [place].  Will get her set up with Dr. Redmond Pulling in the office.    Objective: Vital signs in last 24 hours: Temp:  [97.6 F (36.4 C)-97.7 F (36.5 C)] 97.6 F (36.4 C) (07/10 0445) Pulse Rate:  [73-94] 73 (07/10 0445) Resp:  [15-16] 16 (07/10 0445) BP: (101-125)/(70-84) 101/70 (07/10 0445) SpO2:  [96 %-100 %] 96 % (07/10 0445) Last BM Date: 08/05/18 1200 IV PO not recorded Urine 900 No Bm recorded Afebrile, VSS  Intake/Output from previous day: 07/09 0701 - 07/10 0700 In: 2014.3 [I.V.:2000; IV Piggyback:14.3] Out: 1400 [Urine:1400] Intake/Output this shift: No intake/output data recorded.  General appearance: alert, cooperative and no distress GI: soft, non-tender; bowel sounds normal; no masses,  no organomegaly  Lab Results:  Recent Labs    08/03/18 1840 08/04/18 0020  WBC 18.9* 13.1*  HGB 15.2* 14.2  HCT 46.1* 45.0  PLT 297 203    BMET Recent Labs    08/03/18 1840 08/04/18 0020  NA 136 137  K 3.5 4.5  CL 101 105  CO2 17* 20*  GLUCOSE 338* 373*  BUN 22* 23*  CREATININE 0.90 0.71  CALCIUM 9.8 8.4*   PT/INR Recent Labs    08/04/18 0008  LABPROT 13.0  INR 1.0    Recent Labs  Lab 08/03/18 1840  AST 26  ALT 23  ALKPHOS 119  BILITOT 0.2*  PROT 7.8  ALBUMIN 4.6     Lipase     Component Value Date/Time   LIPASE 62 (H) 08/03/2018 1840     Medications: . atorvastatin  10 mg Oral QPM  . diatrizoate meglumine-sodium  90 mL Per NG tube Once  . lisinopril  20 mg Oral Daily   Or  . hydrochlorothiazide  12.5 mg Oral Daily  . insulin aspart  0-15 Units Subcutaneous Q4H    Assessment/Plan Sepsis Diabetes Type II Hypertension Hyperlipidemia  Hx depression Hx tobacco use COVID negative  Recurrent SBO Hx of gastric bypass/ovarian cyst removal and hernia repair  FEN: full liquids/refused NG/IV fluids DVT: SCD's - she can  have Lovenox/heparin from our standpoint ID: Rocephin/Flagyl x 1 yesterday; Zosyn 7/8 - 7/9 Follow up: TBD POC: MEADOWS,ANNALEE  [phone]   Plan:  She is doing well, tolerating full liquids.  She wants to see Dr. Redmond Pulling for follow up and diagnostic laparoscopy and we have an appointment for her in [facility].        LOS: 3 days    Nakoa Ganus 08/06/2018 [phone]

## 2018-08-06 NOTE — Discharge Instructions (Signed)
Bowel Obstruction °A bowel obstruction means that something is blocking the small or large bowel. The bowel is also called the intestine. It is the long tube that connects the stomach to the opening of the butt (anus). When something blocks the bowel, food and fluids cannot pass through like normal. This condition needs to be treated. Treatment depends on the cause of the problem and how bad the problem is. °What are the causes? °Common causes of this condition include: °· Scar tissue (adhesions) from past surgery or from high-energy X-rays (radiation). °· Recent surgery in the belly. This affects how food moves in the bowel. °· Some diseases, such as: °? Irritation of the lining of the digestive tract (Crohn's disease). °? Irritation of small pouches in the bowel (diverticulitis). °· Growths or tumors. °· A bulging organ (hernia). °· Twisting of the bowel (volvulus). °· A foreign body. °· Slipping of a part of the bowel into another part (intussusception). °What are the signs or symptoms? °Symptoms of this condition include: °· Pain in the belly. °· Feeling sick to your stomach (nauseous). °· Throwing up (vomiting). °· Bloating in the belly. °· Being unable to pass gas. °· Trouble pooping (constipation). °· Watery poop (diarrhea). °· A lot of belching. °How is this diagnosed? °This condition may be diagnosed based on: °· A physical exam. °· Medical history. °· Imaging tests, such as X-ray or CT scan. °· Blood tests. °· Urine tests. °How is this treated? °Treatment for this condition may include: °· Fluids and pain medicines that are given through an IV tube. Your doctor may tell you not to eat or drink if you feel sick to your stomach and are throwing up. °· Eating a clear liquid diet for a few days. °· Putting a small tube (nasogastric tube) into the stomach. This will help with pain, discomfort, and nausea by removing blocked air and fluids from the stomach. °· Surgery. This may be needed if other treatments do  not work. °Follow these instructions at home: °Medicines °· Take over-the-counter and prescription medicines only as told by your doctor. °· If you were prescribed an antibiotic medicine, take it as told by your doctor. Do not stop taking the antibiotic even if you start to feel better. °General instructions °· Follow your diet as told by your doctor. You may need to: °? Only drink clear liquids until you start to get better. °? Avoid solid foods. °· Return to your normal activities as told by your doctor. Ask your doctor what activities are safe for you. °· Do not sit for a long time without moving. Get up to take short walks every 1-2 hours. This is important. Ask for help if you feel weak or unsteady. °· Keep all follow-up visits as told by your doctor. This is important. °How is this prevented? °After having a bowel obstruction, you may be more likely to have another. You can do some things to stop it from happening again. °· If you have a long-term (chronic) disease, contact your doctor if you see changes or problems. °· Take steps to prevent or treat trouble pooping. Your doctor may ask that you: °? Drink enough fluid to keep your pee (urine) pale yellow. °? Take over-the-counter or prescription medicines. °? Eat foods that are high in fiber. These include beans, whole grains, and fresh fruits and vegetables. °? Limit foods that are high in fat and sugar. These include fried or sweet foods. °· Stay active. Ask your doctor which exercises are   safe for you.  Avoid stress.  Eat three small meals and three small snacks each day.  Work with a Psychologist, prison and probation servicesfood expert (dietitian) to make a meal plan that works for you.  Do not use any products that contain nicotine or tobacco, such as cigarettes and e-cigarettes. If you need help quitting, ask your doctor. Contact a doctor if:  You have a fever.  You have chills. Get help right away if:  You have pain or cramps that get worse.  You throw up blood.  You are  sick to your stomach.  You cannot stop throwing up.  You cannot drink fluids.  You feel mixed up (confused).  You feel very thirsty (dehydrated).  Your belly gets more bloated.  You feel weak or you pass out (faint). Summary  A bowel obstruction means that something is blocking the small or large bowel.  Treatment may include IV fluids and pain medicine. You may also have a clear liquid diet, a small tube in your stomach, or surgery.  Drink clear liquids and avoid solid foods until you get better. This information is not intended to replace advice given to you by your health care provider. Make sure you discuss any questions you have with your health care provider. Document Released: 02/21/2004 Document Revised: 05/27/2017 Document Reviewed: 05/27/2017 Elsevier Patient Education  2020 [employer]   Full Liquid Diet A full liquid diet refers to fluids and foods that are liquid or will become liquid at room temperature. This diet should only be used for a short period of time to help you recover from illness or surgery. Your health care provider or dietitian will help you determine when it is safe to eat regular foods. What are tips for following this plan?     Reading food labels  Check food labels of nutrition shakes for the amount of protein. Look for nutrition shakes that have at least 8-10 grams of protein in each serving.  Look for drinks, such as milks and juices, that are "fortified" or "enriched." This means that vitamins and minerals have been added. Shopping  Buy premade nutritional shakes to keep on hand.  To vary your choices, buy different flavors of milks and shakes. Meal planning  Choose flavors and foods that you enjoy.  To make sure you get enough energy from food (calories): ? Eat 3 full liquid meals each day. Have a liquid snack between each meal. ? Drink 6-8 ounces (177-237 ml) of a nutrition supplement shake with meals or as snacks. ? Add protein  powder, powdered milk, milk, or yogurt to shakes to increase the amount of protein.  Drink at least one serving a day of citrus fruit juice or fruit juice that has vitamin C added. General guidelines  Before starting the full-liquid diet, check with your health care provider to know what foods you should avoid. These may include full-fat or high-fiber liquids.  You may have any liquid or food that becomes a liquid at room temperature. The food is considered a liquid if it can be poured off a spoon at room temperature.  Do not drink alcohol unless approved by your health care provider.  This diet gives you most of the nutrients that you need for energy, but you may not get enough of certain vitamins, minerals, and fiber. Make sure to talk to your health care provider or dietitian about: ? How many calories you need to eat get day. ? How much fluid you should have each day. ?  Taking a multivitamin or a nutritional supplement. What foods are allowed? The items listed may not be a complete list. Talk with your dietitian about what dietary choices are best for you. Grains Thin hot cereal, such as cream of wheat. Soft-cooked pasta or rice pured in soup. Vegetables Pulp-free tomato or vegetable juice. Vegetables pured in soup. Fruits Fruit juice without pulp. Strained fruit pures (seeds and skins removed). Meats and other protein foods Beef, chicken, and fish broths. Powdered protein supplements. Dairy Milk and milk-based beverages, including milk shakes and instant breakfast mixes. Smooth yogurt. Pured cottage cheese. Beverages Water. Coffee and tea (caffeinated or decaffeinated). Cocoa. Liquid nutritional supplements. Soft drinks. Nondairy milks, such as almond, coconut, rice, or soy milk. Fats and oils Melted margarine and butter. Cream. Canola, almond, avocado, corn, grapeseed, sunflower, and sesame oils. Gravy. Sweets and desserts Custard. Pudding. Flavored gelatin. Smooth ice cream  (without nuts or candy pieces). Sherbet. Popsicles. [place] ice. Pudding pops. Seasoning and other foods Salt and pepper. Spices. Cocoa powder. Vinegar. Ketchup. Yellow mustard. Smooth sauces, such as [place], cheese sauce, or white sauce. Soy sauce. Cream soups. Strained soups. Syrup. Honey. Jelly (without fruit pieces). What foods are not allowed? The items listed may not be a complete list. Talk with your dietitian about what dietary choices are best for you. Grains Whole grains. Pasta. Rice. Cold cereal. Bread. Crackers. Vegetables All whole fresh, frozen, or canned vegetables. Fruits All whole fresh, frozen, or canned fruits. Meats and other protein foods All cuts of meat, poultry, and fish. Eggs. Tofu and soy protein. Nuts and nut butters. Lunch meat. Sausage. Dairy Hard cheese. Yogurt with fruit chunks. Fats and oils Coconut oil. Palm oil. Lard. Cold butter. Sweets and desserts Ice cream or other frozen desserts that have any solids in them or on top, such as nuts, chocolate chips, and pieces of cookies. Cakes. Cookies. Candy. Seasoning and other foods Stone-ground mustards. Soups with chunks or pieces. Summary  A full liquid diet refers to fluids and foods that are liquid or will become liquid at room temperature.  This diet should only be used for a short period of time to help you recover from illness or surgery. Ask your health care provider or dietitian when it is safe for you to eat regular foods.  To make sure you get enough calories and nutrients, eat 3 meals each day with snacks between. Drink premade nutrition supplement shakes or add protein powder to homemade shakes. Take a vitamin and mineral supplement as told by your health care provider. This information is not intended to replace advice given to you by your health care provider. Make sure you discuss any questions you have with your health care provider. Document Released: 01/13/2005 Document Revised:  04/11/2017 Document Reviewed: 02/27/2016 Elsevier Patient Education  2020 [employer]   Soft-Food Eating Plan A soft-food eating plan includes foods that are safe and easy to chew and swallow. Your health care provider or dietitian can help you find foods and flavors that fit into this plan. Follow this plan until your health care provider or dietitian says it is safe to start eating other foods and food textures. What are tips for following this plan? General guidelines   Take small bites of food, or cut food into pieces about  inch or smaller. Bite-sized pieces of food are easier to chew and swallow.  Eat moist foods. Avoid overly dry foods.  Avoid foods that: ? Are difficult to swallow, such as dry, chunky, crispy, or  sticky foods. ? Are difficult to chew, such as hard, tough, or stringy foods. ? Contain nuts, seeds, or fruits.  Follow instructions from your dietitian about the types of liquids that are safe for you to swallow. You may be allowed to have: ? Thick liquids only. This includes only liquids that are thicker than honey. ? Thin and thick liquids. This includes all beverages and foods that become liquid at room temperature.  To make thick liquids: ? Purchase a commercial liquid thickening powder. These are available at grocery stores and pharmacies. ? Mix the thickener into liquids according to instructions on the label. ? Purchase ready-made thickened liquids. ? Thicken soup by pureeing, straining to remove chunks, and adding flour, potato flakes, or corn starch. ? Add commercial thickener to foods that become liquid at room temperature, such as milk shakes, yogurt, ice cream, gelatin, and sherbet.  Ask your health care provider whether you need to take a fiber supplement. Cooking  Cook meats so they stay tender and moist. Use methods like braising, stewing, or baking in liquid.  Cook vegetables and fruit until they are soft enough to be mashed with a  fork.  Peel soft, fresh fruits such as peaches, nectarines, and melons.  When making soup, make sure chunks of meat and vegetables are smaller than  inch.  Reheat leftover foods slowly so that a tough crust does not form. What foods are allowed? The items listed below may not be a complete list. Talk with your dietitian about what dietary choices are best for you. Grains Breads, muffins, pancakes, or waffles moistened with syrup, jelly, or butter. Dry cereals well-moistened with milk. Moist, cooked cereals. Well-cooked pasta and rice. Vegetables All soft-cooked vegetables. Shredded lettuce. Fruits All canned and cooked fruits. Soft, peeled fresh fruits. Strawberries. Dairy Milk. Cream. Yogurt. Cottage cheese. Soft cheese without the rind. Meats and other protein foods Tender, moist ground meat, poultry, or fish. Meat cooked in gravy or sauces. Eggs. Sweets and desserts Ice cream. Milk shakes. Sherbet. Pudding. Fats and oils Butter. Margarine. Olive, canola, sunflower, and grapeseed oil. Smooth salad dressing. Smooth cream cheese. Mayonnaise. Gravy. What foods are not allowed? The items listed bemay not be a complete list. Talk with your dietitian about what dietary choices are best for you. Grains Coarse or dry cereals, such as bran, granola, and shredded wheat. Tough or chewy crusty breads, such as [place] bread or baguettes. Breads with nuts, seeds, or fruit. Vegetables All raw vegetables. Cooked corn. Cooked vegetables that are tough or stringy. Tough, crisp, fried potatoes and potato skins. Fruits Fresh fruits with skins or seeds, or both, such as apples, pears, and grapes. Stringy, high-pulp fruits, such as papaya, pineapple, coconut, and mango. Fruit leather and all dried fruit. Dairy Yogurt with nuts or coconut. Meats and other protein foods Hard, dry sausages. Dry meat, poultry, or fish. Meats with gristle. Fish with bones. Fried meat or fish. Lunch meat and hotdogs. Nuts  and seeds. Chunky peanut butter or other nut butters. Sweets and desserts Cakes or cookies that are very dry or chewy. Desserts with dried fruit, nuts, or coconut. Fried pastries. Very rich pastries. Fats and oils Cream cheese with fruit or nuts. Salad dressings with seeds or chunks. Summary  A soft-food eating plan includes foods that are safe and easy to swallow. Generally, the foods should be soft enough to be mashed with a fork.  Avoid foods that are dry, hard to chew, crunchy, sticky, stringy, or crispy.  Ask your health  care provider whether you need to thicken your liquids and if you need to take a fiber supplement. This information is not intended to replace advice given to you by your health care provider. Make sure you discuss any questions you have with your health care provider. Document Released: 04/22/2007 Document Revised: 05/06/2018 Document Reviewed: 03/18/2016 Elsevier Patient Education  2020 [employer].

## 2018-08-09 LAB — CULTURE, BLOOD (ROUTINE X 2)
Culture: NO GROWTH
Culture: NO GROWTH
Special Requests: ADEQUATE
Special Requests: ADEQUATE

## 2018-08-12 ENCOUNTER — Ambulatory Visit: Payer: Self-pay | Admitting: General Surgery

## 2018-08-12 ENCOUNTER — Other Ambulatory Visit (HOSPITAL_COMMUNITY): Payer: Self-pay | Admitting: *Deleted

## 2018-08-12 NOTE — Progress Notes (Signed)
SPOKE WITH DR Redmond Pulling BY PHONE, NO BOWEL PREP REQUIRED, PATIENT TO HAVE 1 G2 DRINK WITH CLEAR LIQUIDS UNTIL 3 HOURS BEFORE SURGERY

## 2018-08-12 NOTE — H&P (View-Only) (Signed)
Ashlee Thomas Documented: 08/12/2018 8:28 AM Location: [facility] Patient #: [id number] DOB: September 26, 1960 Divorced / Language: [place] / Race: White Female  History of Present Illness Minerva Areola M. Jozlin Bently MD; 08/12/2018 8:53 AM) The patient is a 58 year old female who presents with small bowel obstruction. She comes back in regarding heR ongoing recurrent small bowel obstructions. She was recently admitted on July 7 through the 10th that was a long hospital for severe mid abdominal pain and was found to have a partial small bowel obstruction with a transition in the right upper pelvis. She was medically managed and quickly improved. This is an ongoing repetitive event for her. I initially met her about a month ago. She is had 2 hospitalizations within the past few months 1 and [facility] 1 in [place] for same symptoms. And in reviewing the CT scan reports it appears that the transition point is in the upper pelvis. She states after her last visit she did quite well with liquids and soft foods but transition to solid foods eventually. She was driving back to [place] the other week when she had recurrent severe abdominal pain and she turned around and through back to [place]. She states now she is doing well. No abdominal pain. Tolerating liquids and soft foods. Having bowel movements. No fever, chills, nausea, chest pain, source of breath, dyspnea on exertion. I reviewed her CT scan and labs as well as personally reviewed the imaging myself  07/08/18 She comes in to discuss her recent issues with small bowel obstructions. 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 We have not seen her in probably about 8 years. She currently lives in [place]. She had a remote history of an ovarian cystectomy through a lower midline incision many years ago. She states that after her gastric bypass surgery a few years afterwards she developed a bulge in her left  mid abdomen. She states that she underwent hernia surgery with mesh through tiny incisions. She states that the mesh tore at another surgeon when in laparoscopically through tiny incisions and removed a lot of the old mesh but left some behind and repaired her hernia. Otherwise she denies any additional surgeries other than an appendectomy many years ago. She states around [facility] while in [place] she had an episode of severe intense pain behind her belly button. She ended up going to the emergency room and staying in the hospital 2 nights for bowel obstruction. She was managed without surgery. She states that they did a CT scan and her pain resolved very quickly. She was then driving up to [place] to see her daughter who lives in [place] when she developed recurrent severe abdominal pain around her umbilicus. She describes it as a stabbing sensation. She was in [facility] and was admitted to [facility] there. I do not have a copy of her CT scan/images but I have the report along with some of the physician notes. They described a small bowel obstruction with gastric bypass changes with a transition point at the sacral promontory. She was managed without surgery and her symptoms resolved and she was discharged. Since then she states she is doing well. She continued to have bowel movements during each hospitalization. She states for several years now she will have episodes of crampy abdominal pain and feel bloated that will last for 1-3 hours and if she lays down the discomfort will go away. She may have one of these episodes a  week. Currently she is doing well. She is tolerating a diet. No nausea. No fever or chills. No current abdominal pain having bowel function. She takes a multivitamin and calcium daily   Problem List/Past Medical Minerva Areola M. Andrey Campanile, MD; 08/12/2018 8:56 AM) S/P LAPAROSCOPIC HERNIA REPAIR (Z98.890) at OSH x 2 HISTORY OF SMALL BOWEL  OBSTRUCTION (Z87.19)  Past Surgical History Minerva Areola M. Andrey Campanile, MD; 08/12/2018 8:56 AM) Appendectomy Laparoscopic Inguinal Hernia Surgery multiple Oral Surgery Tonsillectomy  Diagnostic Studies History Minerva Areola M. Andrey Campanile, MD; 08/12/2018 8:56 AM) Colonoscopy 5-10 years ago Mammogram 1-3 years ago Pap Smear 1-5 years ago  Allergies Kristen Cardinal, CMA; 08/12/2018 8:29 AM) No Known Drug Allergies [07/08/2018]: Allergies Reconciled  Medication History Kristen Cardinal, CMA; 08/12/2018 8:29 AM) Atorvastatin Calcium (10MG  Tablet, Oral) Active. Bydureon BCise (2MG /0.85ML Auto-injector, Subcutaneous) Active. Lisinopril-hydroCHLOROthiazide (20-12.5MG  Tablet, Oral) Active. metFORMIN HCl ER (500MG  Tablet ER 24HR, Oral) Active. Iron (18MG  Tablet ER, Oral) Active. Vitamin D (Cholecalciferol) (10 MCG(400 UNIT) Capsule, Oral) Active. One-A-Day Womens (Oral) Active. Medications Reconciled  Social History Minerva Areola M. Andrey Campanile, MD; 08/12/2018 8:56 AM) Alcohol use Occasional alcohol use. Caffeine use Carbonated beverages, Coffee. No drug use Tobacco use Former smoker.  Family History Minerva Areola M. Andrey Campanile, MD; 08/12/2018 8:56 AM) Breast Cancer Family Members In General. Depression Father. Diabetes Mellitus Father, Mother. Hypertension Father. Ischemic Bowel Disease Father. Prostate Cancer Father.  Pregnancy / Birth History Minerva Areola M. Andrey Campanile, MD; 08/12/2018 8:56 AM) Age at menarche 11 years. Age of menopause 24-50 Gravida 5 Length (months) of breastfeeding 3-6 Maternal age 34-25 Para 5 Regular periods  Other Problems Minerva Areola M. Andrey Campanile, MD; 08/12/2018 8:56 AM) Depression Hypercholesterolemia Oophorectomy Left. Umbilical Hernia Repair GASTRIC BYPASS STATUS FOR OBESITY (Z98.84) 2011 Dr Johna Sheriff TYPE 2 DIABETES MELLITUS TREATED WITHOUT INSULIN (E11.9) HYPERTENSION, ESSENTIAL (I10)     Review of Systems Minerva Areola M. Antwuan Eckley MD; 08/12/2018 8:53 AM) All other systems  negative  Vitals (Sabrina Canty CMA; 08/12/2018 8:29 AM) 08/12/2018 8:29 AM Weight: 180.38 lb Height: 67in Body Surface Area: 1.94 m Body Mass Index: 28.25 kg/m  Temp.: 35F(Oral)  Pulse: 139 (Regular)  BP: 118/62 (Sitting, Left Arm, Standard)        Physical Exam Minerva Areola M. Dela Sweeny MD; 08/12/2018 8:53 AM)  General Mental Status-Alert. General Appearance-Consistent with stated age. Hydration-Well hydrated. Voice-Normal.  Head and Neck Head-normocephalic, atraumatic with no lesions or palpable masses. Trachea-midline. Thyroid Gland Characteristics - normal size and consistency.  Eye Eyeball - Bilateral-Normal. Sclera/Conjunctiva - Bilateral-No scleral icterus.  ENMT Ears Pinna - Bilateral - no bony growth in lateral aspect of ear canal, no edema. Nose and Sinuses External Inspection of the Nose - symmetric, no deformities observed.  Chest and Lung Exam Chest and lung exam reveals -quiet, even and easy respiratory effort with no use of accessory muscles and on auscultation, normal breath sounds, no adventitious sounds and normal vocal resonance. Inspection Chest Wall - Normal. Back - normal.  Breast - Did not examine.  Cardiovascular Cardiovascular examination reveals -normal heart sounds, regular rate and rhythm with no murmurs and normal pedal pulses bilaterally.  Abdomen Inspection Inspection of the abdomen reveals - No Hernias. Skin - Scar - Note: well healed trocar scars; lower midline incision. Palpation/Percussion Palpation and Percussion of the abdomen reveal - Soft, Non Tender, No Rebound tenderness, No Rigidity (guarding) and No hepatosplenomegaly. Auscultation Auscultation of the abdomen reveals - Bowel sounds normal.  Peripheral Vascular Upper Extremity Palpation - Pulses bilaterally normal.  Neurologic Neurologic evaluation reveals -alert and oriented x 3 with no impairment of  recent or remote memory. Mental  Status-Normal.  Neuropsychiatric The patient's mood and affect are described as -normal. Judgment and Insight-insight is appropriate concerning matters relevant to self.  Musculoskeletal Normal Exam - Left-Upper Extremity Strength Normal and Lower Extremity Strength Normal. Normal Exam - Right-Upper Extremity Strength Normal and Lower Extremity Strength Normal.  Lymphatic Head & Neck  General Head & Neck Lymphatics: Bilateral - Description - Normal. Axillary - Did not examine. Femoral & Inguinal - Did not examine.    Assessment & Plan Minerva Areola M. Augustus Zurawski MD; 08/12/2018 8:56 AM)  HISTORY OF SMALL BOWEL OBSTRUCTION (Z87.19) Impression: Clinically she currently does not have symptoms consistent with a bowel obstruction. She is tolerating a diet. She is having bowel function. She is not having any abdominal distention. In reviewing the CT report and her most recent CT scan I do not believe her recent bowel obstructions are due to a classic internal hernia due to gastric bypass. It would be fairly atypical to see an internal hernia due to gastric bypass anatomy this far out. Given her prior incisional hernia repairs and ovarian cystectomy & the suggestion of a transition point in the lower pelvis I believe that she probably has scar tissue/adhesions from the surgeries which caused her recent bowel obstructions. We had another long conversation about bowel obstructions as well as their management. We discussed indications for surgery which would be failure of resolution and/or worsening bowel blockage. We also discussed that should she keep having frequent admissions in a short time span for bowel obstruction then it would be reasonable to offer exploratory surgery. I explained to her that my guess is that she has a chronic adhesive band that is causing the intermittent crampy episodes that she's been having on and off for a few years now. This is now her third admission in several months. She  wants to proceed with exploratory surgery. I think that is reasonable given her frequent admissions recently. We discussed that we would plan for a diagnostic laparoscopy possible exploratory laparotomy and possible bowel resection. We discussed risk and benefits of surgery including but not limited to bleeding, infection, injury to surrounding structures, enterotomy, possible bowel resection with associated risks such as stricture or leak, incisional hernia formation, blood clot formation, perioperative cardiac and pulmonary events, typical hospitalization, criteria for discharge, death. We discussed operating during coronavirus pandemic. We discussed the importance of social isolation, wearing  Current Plans You are being scheduled for surgery- Our schedulers will call you.  YOU ARE BEING SCHEDULED FOR DIAGNOSTIC LAPAROSCOPY, POSSIBLE EXPLORATORY LAPAROTOMY, POSSIBLE BOWEL RESECTION  You should hear from our office's scheduling department within 5 working days about the location, date, and time of surgery. We try to make accommodations for patient's preferences in scheduling surgery, but sometimes the OR schedule or the surgeon's schedule prevents [place] from making those accommodations.  If you have not heard from our office [phone]) in 5 working days, call the office and ask for your surgeon's nurse.  If you have other questions about your diagnosis, plan, or surgery, call the office and ask for your surgeon's nurse.   S/P LAPAROSCOPIC HERNIA REPAIR (Z98.890) Story: at OSH x 2   GASTRIC BYPASS STATUS FOR OBESITY (P29.51) Story: 2011 Dr Johna Sheriff Impression: I don't think her recent SBOs were due to gastric bypass internal hernia defects in reading outside CT report.   HYPERTENSION, ESSENTIAL (I10)   TYPE 2 DIABETES MELLITUS TREATED WITHOUT INSULIN (E11.9)  Mary Sella. Andrey Campanile, MD, FACS General, Bariatric, & Minimally Invasive  Surgery [facility], [place]

## 2018-08-12 NOTE — H&P (Signed)
Ashlee Thomas Documented: 08/12/2018 8:28 AM Location: [facility] Patient #: [id number] DOB: September 26, 1960 Divorced / Language: [place] / Race: White Female  History of Present Illness Minerva Areola M. Jozlin Bently MD; 08/12/2018 8:53 AM) The patient is a 58 year old female who presents with small bowel obstruction. She comes back in regarding heR ongoing recurrent small bowel obstructions. She was recently admitted on July 7 through the 10th that was a long hospital for severe mid abdominal pain and was found to have a partial small bowel obstruction with a transition in the right upper pelvis. She was medically managed and quickly improved. This is an ongoing repetitive event for her. I initially met her about a month ago. She is had 2 hospitalizations within the past few months 1 and [facility] 1 in [place] for same symptoms. And in reviewing the CT scan reports it appears that the transition point is in the upper pelvis. She states after her last visit she did quite well with liquids and soft foods but transition to solid foods eventually. She was driving back to [place] the other week when she had recurrent severe abdominal pain and she turned around and through back to [place]. She states now she is doing well. No abdominal pain. Tolerating liquids and soft foods. Having bowel movements. No fever, chills, nausea, chest pain, source of breath, dyspnea on exertion. I reviewed her CT scan and labs as well as personally reviewed the imaging myself  07/08/18 She comes in to discuss her recent issues with small bowel obstructions. 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 We have not seen her in probably about 8 years. She currently lives in [place]. She had a remote history of an ovarian cystectomy through a lower midline incision many years ago. She states that after her gastric bypass surgery a few years afterwards she developed a bulge in her left  mid abdomen. She states that she underwent hernia surgery with mesh through tiny incisions. She states that the mesh tore at another surgeon when in laparoscopically through tiny incisions and removed a lot of the old mesh but left some behind and repaired her hernia. Otherwise she denies any additional surgeries other than an appendectomy many years ago. She states around [facility] while in [place] she had an episode of severe intense pain behind her belly button. She ended up going to the emergency room and staying in the hospital 2 nights for bowel obstruction. She was managed without surgery. She states that they did a CT scan and her pain resolved very quickly. She was then driving up to [place] to see her daughter who lives in [place] when she developed recurrent severe abdominal pain around her umbilicus. She describes it as a stabbing sensation. She was in [facility] and was admitted to [facility] there. I do not have a copy of her CT scan/images but I have the report along with some of the physician notes. They described a small bowel obstruction with gastric bypass changes with a transition point at the sacral promontory. She was managed without surgery and her symptoms resolved and she was discharged. Since then she states she is doing well. She continued to have bowel movements during each hospitalization. She states for several years now she will have episodes of crampy abdominal pain and feel bloated that will last for 1-3 hours and if she lays down the discomfort will go away. She may have one of these episodes a  week. Currently she is doing well. She is tolerating a diet. No nausea. No fever or chills. No current abdominal pain having bowel function. She takes a multivitamin and calcium daily   Problem List/Past Medical Minerva Areola M. Andrey Campanile, MD; 08/12/2018 8:56 AM) S/P LAPAROSCOPIC HERNIA REPAIR (Z98.890) at OSH x 2 HISTORY OF SMALL BOWEL  OBSTRUCTION (Z87.19)  Past Surgical History Minerva Areola M. Andrey Campanile, MD; 08/12/2018 8:56 AM) Appendectomy Laparoscopic Inguinal Hernia Surgery multiple Oral Surgery Tonsillectomy  Diagnostic Studies History Minerva Areola M. Andrey Campanile, MD; 08/12/2018 8:56 AM) Colonoscopy 5-10 years ago Mammogram 1-3 years ago Pap Smear 1-5 years ago  Allergies Kristen Cardinal, CMA; 08/12/2018 8:29 AM) No Known Drug Allergies [07/08/2018]: Allergies Reconciled  Medication History Kristen Cardinal, CMA; 08/12/2018 8:29 AM) Atorvastatin Calcium (10MG  Tablet, Oral) Active. Bydureon BCise (2MG /0.85ML Auto-injector, Subcutaneous) Active. Lisinopril-hydroCHLOROthiazide (20-12.5MG  Tablet, Oral) Active. metFORMIN HCl ER (500MG  Tablet ER 24HR, Oral) Active. Iron (18MG  Tablet ER, Oral) Active. Vitamin D (Cholecalciferol) (10 MCG(400 UNIT) Capsule, Oral) Active. One-A-Day Womens (Oral) Active. Medications Reconciled  Social History Minerva Areola M. Andrey Campanile, MD; 08/12/2018 8:56 AM) Alcohol use Occasional alcohol use. Caffeine use Carbonated beverages, Coffee. No drug use Tobacco use Former smoker.  Family History Minerva Areola M. Andrey Campanile, MD; 08/12/2018 8:56 AM) Breast Cancer Family Members In General. Depression Father. Diabetes Mellitus Father, Mother. Hypertension Father. Ischemic Bowel Disease Father. Prostate Cancer Father.  Pregnancy / Birth History Minerva Areola M. Andrey Campanile, MD; 08/12/2018 8:56 AM) Age at menarche 11 years. Age of menopause 24-50 Gravida 5 Length (months) of breastfeeding 3-6 Maternal age 34-25 Para 5 Regular periods  Other Problems Minerva Areola M. Andrey Campanile, MD; 08/12/2018 8:56 AM) Depression Hypercholesterolemia Oophorectomy Left. Umbilical Hernia Repair GASTRIC BYPASS STATUS FOR OBESITY (Z98.84) 2011 Dr Johna Sheriff TYPE 2 DIABETES MELLITUS TREATED WITHOUT INSULIN (E11.9) HYPERTENSION, ESSENTIAL (I10)     Review of Systems Minerva Areola M. Antwuan Eckley MD; 08/12/2018 8:53 AM) All other systems  negative  Vitals (Sabrina Canty CMA; 08/12/2018 8:29 AM) 08/12/2018 8:29 AM Weight: 180.38 lb Height: 67in Body Surface Area: 1.94 m Body Mass Index: 28.25 kg/m  Temp.: 35F(Oral)  Pulse: 139 (Regular)  BP: 118/62 (Sitting, Left Arm, Standard)        Physical Exam Minerva Areola M. Dela Sweeny MD; 08/12/2018 8:53 AM)  General Mental Status-Alert. General Appearance-Consistent with stated age. Hydration-Well hydrated. Voice-Normal.  Head and Neck Head-normocephalic, atraumatic with no lesions or palpable masses. Trachea-midline. Thyroid Gland Characteristics - normal size and consistency.  Eye Eyeball - Bilateral-Normal. Sclera/Conjunctiva - Bilateral-No scleral icterus.  ENMT Ears Pinna - Bilateral - no bony growth in lateral aspect of ear canal, no edema. Nose and Sinuses External Inspection of the Nose - symmetric, no deformities observed.  Chest and Lung Exam Chest and lung exam reveals -quiet, even and easy respiratory effort with no use of accessory muscles and on auscultation, normal breath sounds, no adventitious sounds and normal vocal resonance. Inspection Chest Wall - Normal. Back - normal.  Breast - Did not examine.  Cardiovascular Cardiovascular examination reveals -normal heart sounds, regular rate and rhythm with no murmurs and normal pedal pulses bilaterally.  Abdomen Inspection Inspection of the abdomen reveals - No Hernias. Skin - Scar - Note: well healed trocar scars; lower midline incision. Palpation/Percussion Palpation and Percussion of the abdomen reveal - Soft, Non Tender, No Rebound tenderness, No Rigidity (guarding) and No hepatosplenomegaly. Auscultation Auscultation of the abdomen reveals - Bowel sounds normal.  Peripheral Vascular Upper Extremity Palpation - Pulses bilaterally normal.  Neurologic Neurologic evaluation reveals -alert and oriented x 3 with no impairment of  recent or remote memory. Mental  Status-Normal.  Neuropsychiatric The patient's mood and affect are described as -normal. Judgment and Insight-insight is appropriate concerning matters relevant to self.  Musculoskeletal Normal Exam - Left-Upper Extremity Strength Normal and Lower Extremity Strength Normal. Normal Exam - Right-Upper Extremity Strength Normal and Lower Extremity Strength Normal.  Lymphatic Head & Neck  General Head & Neck Lymphatics: Bilateral - Description - Normal. Axillary - Did not examine. Femoral & Inguinal - Did not examine.    Assessment & Plan Minerva Areola M. Augustus Zurawski MD; 08/12/2018 8:56 AM)  HISTORY OF SMALL BOWEL OBSTRUCTION (Z87.19) Impression: Clinically she currently does not have symptoms consistent with a bowel obstruction. She is tolerating a diet. She is having bowel function. She is not having any abdominal distention. In reviewing the CT report and her most recent CT scan I do not believe her recent bowel obstructions are due to a classic internal hernia due to gastric bypass. It would be fairly atypical to see an internal hernia due to gastric bypass anatomy this far out. Given her prior incisional hernia repairs and ovarian cystectomy & the suggestion of a transition point in the lower pelvis I believe that she probably has scar tissue/adhesions from the surgeries which caused her recent bowel obstructions. We had another long conversation about bowel obstructions as well as their management. We discussed indications for surgery which would be failure of resolution and/or worsening bowel blockage. We also discussed that should she keep having frequent admissions in a short time span for bowel obstruction then it would be reasonable to offer exploratory surgery. I explained to her that my guess is that she has a chronic adhesive band that is causing the intermittent crampy episodes that she's been having on and off for a few years now. This is now her third admission in several months. She  wants to proceed with exploratory surgery. I think that is reasonable given her frequent admissions recently. We discussed that we would plan for a diagnostic laparoscopy possible exploratory laparotomy and possible bowel resection. We discussed risk and benefits of surgery including but not limited to bleeding, infection, injury to surrounding structures, enterotomy, possible bowel resection with associated risks such as stricture or leak, incisional hernia formation, blood clot formation, perioperative cardiac and pulmonary events, typical hospitalization, criteria for discharge, death. We discussed operating during coronavirus pandemic. We discussed the importance of social isolation, wearing  Current Plans You are being scheduled for surgery- Our schedulers will call you.  YOU ARE BEING SCHEDULED FOR DIAGNOSTIC LAPAROSCOPY, POSSIBLE EXPLORATORY LAPAROTOMY, POSSIBLE BOWEL RESECTION  You should hear from our office's scheduling department within 5 working days about the location, date, and time of surgery. We try to make accommodations for patient's preferences in scheduling surgery, but sometimes the OR schedule or the surgeon's schedule prevents [place] from making those accommodations.  If you have not heard from our office [phone]) in 5 working days, call the office and ask for your surgeon's nurse.  If you have other questions about your diagnosis, plan, or surgery, call the office and ask for your surgeon's nurse.   S/P LAPAROSCOPIC HERNIA REPAIR (Z98.890) Story: at OSH x 2   GASTRIC BYPASS STATUS FOR OBESITY (P29.51) Story: 2011 Dr Johna Sheriff Impression: I don't think her recent SBOs were due to gastric bypass internal hernia defects in reading outside CT report.   HYPERTENSION, ESSENTIAL (I10)   TYPE 2 DIABETES MELLITUS TREATED WITHOUT INSULIN (E11.9)  Mary Sella. Andrey Campanile, MD, FACS General, Bariatric, & Minimally Invasive  Surgery [facility], [place]

## 2018-08-12 NOTE — Patient Instructions (Addendum)
YOU HAD  A COVID 19 TEST ON 08-13-2018. Marland Kitchen. ONCE YOUR COVID TEST IS COMPLETED, PLEASE BEGIN THE QUARANTINE INSTRUCTIONS AS OUTLINED IN YOUR HANDOUT.                Ashlee Thomas    Your procedure is scheduled on: 08-17-2018  [facility]  Report to admitting at 930  AM      Call this number if you have problems the morning of surgery [phone]    Remember: Do not eat food or drink liquids :After Midnight. BRUSH YOUR TEETH MORNING OF SURGERY AND RINSE YOUR MOUTH OUT, NO CHEWING GUM CANDY OR MINTS.   NO SOLID FOOD AFTER MIDNIGHT THE NIGHT PRIOR TO SURGERY. NOTHING BY MOUTH EXCEPT CLEAR LIQUIDS UNTIL 830 AM. PLEASE FINISH  G2 DRINK PER SURGEON ORDER 3 HOURS PRIOR TO SCHEDULED SURGERY TIME WHICH NEEDS TO BE COMPLETED AT 830 AM.   CLEAR LIQUID DIET   Foods Allowed                                                                     Foods Excluded  Coffee and tea, regular and decaf                             liquids that you cannot  Plain Jell-O any favor except red or purple                                           see through such as: Fruit ices (not with fruit pulp)                                     milk, soups, orange juice  Iced Popsicles                                    All solid food Carbonated beverages, regular and diet                                    Cranberry, grape and apple juices Sports drinks like Gatorade Lightly seasoned clear broth or consume(fat free) Sugar, honey syrup  Sample Menu Breakfast                                Lunch                                     Supper Cranberry juice                    Beef broth  Chicken broth Jell-O                                     Grape juice                           Apple juice Coffee or tea                        Jell-O                                      Popsicle                                                Coffee or tea                         Coffee or tea  _____________________________________________________________________     Take these medicines the morning of surgery with A SIP OF WATER: NONE  How to Manage Your Diabetes  Before and After Surgery  Why is it important to control my blood sugar before and after surgery? . Improving blood sugar levels before and after surgery helps healing and can limit problems. . A way of improving blood sugar control is eating a healthy diet by: o  Eating less sugar and carbohydrates o  Increasing activity/exercise o  Talking with your doctor about reaching your blood sugar goals . High blood sugars (greater than 180 mg/dL) can raise your risk of infections and slow your recovery, so you will need to focus on controlling your diabetes during the weeks before surgery. . Make sure that the doctor who takes care of your diabetes knows about your planned surgery including the date and location.  How do I manage my blood sugar before surgery? . Check your blood sugar at least 4 times a day, starting 2 days before surgery, to make sure that the level is not too high or low. o Check your blood sugar the morning of your surgery when you wake up and every 2 hours until you get to the Short Stay unit. . If your blood sugar is less than 70 mg/dL, you will need to treat for low blood sugar: o Do not take insulin. o Treat a low blood sugar (less than 70 mg/dL) with  cup of clear juice (cranberry or apple), 4 glucose tablets, OR glucose gel. o Recheck blood sugar in 15 minutes after treatment (to make sure it is greater than 70 mg/dL). If your blood sugar is not greater than 70 mg/dL on recheck, call [phone] for further instructions. . Report your blood sugar to the short stay nurse when you get to Short Stay.  . If you are admitted to the hospital after surgery: o Your blood sugar will be checked by the staff and you will probably be given insulin after surgery (instead of oral diabetes  medicines) to make sure you have good blood sugar levels. o The goal for blood sugar control after surgery is 80-180 mg/dL.   WHAT DO I DO ABOUT MY DIABETES MEDICATION?  Marland Kitchen Do not take oral diabetes  medicines (pills) the morning of surgery.  .  DO NOT TAKE BYDUREON IF DUE ON DAY OF SURGERY.       . THE DAY BEFORE SURGERY TAKE METFORMIN AS USUAL.  .  THE DAY OF SURGERY DO NOT TAKE METFORMIN.Marland Kitchen.  Reviewed and Endorsed by [facility] Patient Education Committee, August 2015                               You may not have any metal on your body including hair pins and              piercings  Do not wear jewelry, make-up, lotions, powders or perfumes, deodorant             Do not wear nail polish.  Do not shave  48 hours prior to surgery.                Do not bring valuables to the hospital. [facility] IS NOT             RESPONSIBLE   FOR VALUABLES.  Contacts, dentures or bridgework may not be worn into surgery.  Leave suitcase in the car. After surgery it may be brought to your room.      _____________________________________________________________________             [facility] - Preparing for Surgery Before surgery, you can play an important role.  Because skin is not sterile, your skin needs to be as free of germs as possible.  You can reduce the number of germs on your skin by washing with CHG (chlorahexidine gluconate) soap before surgery.  CHG is an antiseptic cleaner which kills germs and bonds with the skin to continue killing germs even after washing. Please DO NOT use if you have an allergy to CHG or antibacterial soaps.  If your skin becomes reddened/irritated stop using the CHG and inform your nurse when you arrive at Short Stay. Do not shave (including legs and underarms) for at least 48 hours prior to the first CHG shower.  You may shave your face/neck. Please follow these instructions carefully:  1.  Shower with CHG Soap the night before surgery and the  morning of  Surgery.  2.  If you choose to wash your hair, wash your hair first as usual with your  normal  shampoo.  3.  After you shampoo, rinse your hair and body thoroughly to remove the  shampoo.                           4.  Use CHG as you would any other liquid soap.  You can apply chg directly  to the skin and wash                       Gently with a scrungie or clean washcloth.  5.  Apply the CHG Soap to your body ONLY FROM THE NECK DOWN.   Do not use on face/ open                           Wound or open sores. Avoid contact with eyes, ears mouth and genitals (private parts).                       Wash face,  Medical illustratorGenitals (private parts)  with your normal soap.             6.  Wash thoroughly, paying special attention to the area where your surgery  will be performed.  7.  Thoroughly rinse your body with warm water from the neck down.  8.  DO NOT shower/wash with your normal soap after using and rinsing off  the CHG Soap.                9.  Pat yourself dry with a clean towel.            10.  Wear clean pajamas.            11.  Place clean sheets on your bed the night of your first shower and do not  sleep with pets. Day of Surgery : Do not apply any lotions/deodorants the morning of surgery.  Please wear clean clothes to the hospital/surgery center.  FAILURE TO FOLLOW THESE INSTRUCTIONS MAY RESULT IN THE CANCELLATION OF YOUR SURGERY PATIENT SIGNATURE_________________________________  NURSE SIGNATURE__________________________________  _______________________________________________________________________     WHAT IS A BLOOD TRANSFUSION? Blood Transfusion Information  A transfusion is the replacement of blood or some of its parts. Blood is made up of multiple cells which provide different functions.  Red blood cells carry oxygen and are used for blood loss replacement.  White blood cells fight against infection.  Platelets control bleeding.  Plasma helps clot blood.  Other blood  products are available for specialized needs, such as hemophilia or other clotting disorders. BEFORE THE TRANSFUSION  Who gives blood for transfusions?   Healthy volunteers who are fully evaluated to make sure their blood is safe. This is blood bank blood. Transfusion therapy is the safest it has ever been in the practice of medicine. Before blood is taken from a donor, a complete history is taken to make sure that person has no history of diseases nor engages in risky social behavior (examples are intravenous drug use or sexual activity with multiple partners). The donor's travel history is screened to minimize risk of transmitting infections, such as malaria. The donated blood is tested for signs of infectious diseases, such as HIV and hepatitis. The blood is then tested to be sure it is compatible with you in order to minimize the chance of a transfusion reaction. If you or a relative donates blood, this is often done in anticipation of surgery and is not appropriate for emergency situations. It takes many days to process the donated blood. RISKS AND COMPLICATIONS Although transfusion therapy is very safe and saves many lives, the main dangers of transfusion include:   Getting an infectious disease.  Developing a transfusion reaction. This is an allergic reaction to something in the blood you were given. Every precaution is taken to prevent this. The decision to have a blood transfusion has been considered carefully by your caregiver before blood is given. Blood is not given unless the benefits outweigh the risks. AFTER THE TRANSFUSION  Right after receiving a blood transfusion, you will usually feel much better and more energetic. This is especially true if your red blood cells have gotten low (anemic). The transfusion raises the level of the red blood cells which carry oxygen, and this usually causes an energy increase.  The nurse administering the transfusion will monitor you carefully for  complications. HOME CARE INSTRUCTIONS  No special instructions are needed after a transfusion. You may find your energy is better. Speak with your caregiver about any limitations on activity  for underlying diseases you may have. SEEK MEDICAL CARE IF:   Your condition is not improving after your transfusion.  You develop redness or irritation at the intravenous (IV) site. SEEK IMMEDIATE MEDICAL CARE IF:  Any of the following symptoms occur over the next 12 hours:  Shaking chills.  You have a temperature by mouth above 102 F (38.9 C), not controlled by medicine.  Chest, back, or muscle pain.  People around you feel you are not acting correctly or are confused.  Shortness of breath or difficulty breathing.  Dizziness and fainting.  You get a rash or develop hives.  You have a decrease in urine output.  Your urine turns a dark color or changes to pink, red, or brown. Any of the following symptoms occur over the next 10 days:  You have a temperature by mouth above 102 F (38.9 C), not controlled by medicine.  Shortness of breath.  Weakness after normal activity.  The white part of the eye turns yellow (jaundice).  You have a decrease in the amount of urine or are urinating less often.  Your urine turns a dark color or changes to pink, red, or brown. Document Released: 01/11/2000 Document Revised: 04/07/2011 Document Reviewed: 08/30/2007 [facility]ExitCare Patient Information 2014 [place] [place].  _______________________________________________________________________

## 2018-08-12 NOTE — Progress Notes (Signed)
HEMAGLOBIN A1C 08-04-18 Epic EKG 08-04-18 EPIC

## 2018-08-13 ENCOUNTER — Other Ambulatory Visit (HOSPITAL_COMMUNITY): Payer: Managed Care, Other (non HMO)

## 2018-08-13 ENCOUNTER — Other Ambulatory Visit: Payer: Self-pay

## 2018-08-13 ENCOUNTER — Encounter (HOSPITAL_COMMUNITY)
Admission: RE | Admit: 2018-08-13 | Discharge: 2018-08-13 | Disposition: A | Discharge status: Home / Self Care | Payer: Managed Care, Other (non HMO) | Source: Ambulatory Visit | Attending: General Surgery | Admitting: General Surgery

## 2018-08-13 ENCOUNTER — Other Ambulatory Visit (HOSPITAL_COMMUNITY)
Admission: RE | Admit: 2018-08-13 | Discharge: 2018-08-13 | Disposition: A | Discharge status: Home / Self Care | Payer: Managed Care, Other (non HMO) | Source: Ambulatory Visit | Attending: General Surgery | Admitting: General Surgery

## 2018-08-13 ENCOUNTER — Encounter (HOSPITAL_COMMUNITY): Payer: Self-pay

## 2018-08-13 DIAGNOSIS — K56699 Other intestinal obstruction unspecified as to partial versus complete obstruction: Secondary | ICD-10-CM | POA: Insufficient documentation

## 2018-08-13 DIAGNOSIS — Z01812 Encounter for preprocedural laboratory examination: Secondary | ICD-10-CM | POA: Insufficient documentation

## 2018-08-13 DIAGNOSIS — Z1159 Encounter for screening for other viral diseases: Secondary | ICD-10-CM | POA: Insufficient documentation

## 2018-08-13 LAB — CBC WITH DIFFERENTIAL/PLATELET
Abs Immature Granulocytes: 0.03 10*3/uL (ref 0.00–0.07)
Basophils Absolute: 0.1 10*3/uL (ref 0.0–0.1)
Basophils Relative: 1 %
Eosinophils Absolute: 0.2 10*3/uL (ref 0.0–0.5)
Eosinophils Relative: 4 %
HCT: 43.7 % (ref 36.0–46.0)
Hemoglobin: 13.8 g/dL (ref 12.0–15.0)
Immature Granulocytes: 1 %
Lymphocytes Relative: 19 %
Lymphs Abs: 1.2 10*3/uL (ref 0.7–4.0)
MCH: 29.7 pg (ref 26.0–34.0)
MCHC: 31.6 g/dL (ref 30.0–36.0)
MCV: 94 fL (ref 80.0–100.0)
Monocytes Absolute: 0.6 10*3/uL (ref 0.1–1.0)
Monocytes Relative: 9 %
Neutro Abs: 4.2 10*3/uL (ref 1.7–7.7)
Neutrophils Relative %: 66 %
Platelets: 287 10*3/uL (ref 150–400)
RBC: 4.65 MIL/uL (ref 3.87–5.11)
RDW: 12.8 % (ref 11.5–15.5)
WBC: 6.3 10*3/uL (ref 4.0–10.5)
nRBC: 0 % (ref 0.0–0.2)

## 2018-08-13 LAB — COMPREHENSIVE METABOLIC PANEL
ALT: 21 U/L (ref 0–44)
AST: 26 U/L (ref 15–41)
Albumin: 4.2 g/dL (ref 3.5–5.0)
Alkaline Phosphatase: 111 U/L (ref 38–126)
Anion gap: 10 (ref 5–15)
BUN: 12 mg/dL (ref 6–20)
CO2: 24 mmol/L (ref 22–32)
Calcium: 9.7 mg/dL (ref 8.9–10.3)
Chloride: 104 mmol/L (ref 98–111)
Creatinine, Ser: 0.86 mg/dL (ref 0.44–1.00)
GFR calc Af Amer: >60 mL/min (ref >60)
GFR calc non Af Amer: >60 mL/min (ref >60)
Glucose, Bld: 139 mg/dL — ABNORMAL HIGH (ref 70–99)
Potassium: 5.6 mmol/L — ABNORMAL HIGH (ref 3.5–5.1)
Sodium: 138 mmol/L (ref 135–145)
Total Bilirubin: 0.6 mg/dL (ref 0.3–1.2)
Total Protein: 7.3 g/dL (ref 6.5–8.1)

## 2018-08-13 LAB — SARS CORONAVIRUS 2 (TAT 6-24 HRS): SARS Coronavirus 2: NEGATIVE

## 2018-08-13 LAB — GLUCOSE, CAPILLARY: Glucose-Capillary: 133 mg/dL — ABNORMAL HIGH (ref 70–99)

## 2018-08-13 MED ORDER — CHLORHEXIDINE GLUCONATE 4 % EX LIQD
60.0000 mL | Freq: Once | CUTANEOUS | Status: DC
  Filled 2018-08-13: qty 60

## 2018-08-16 MED ORDER — BUPIVACAINE LIPOSOME 1.3 % IJ SUSP
20.0000 mL | Freq: Once | INTRAMUSCULAR | Status: DC
  Filled 2018-08-16: qty 20

## 2018-08-16 NOTE — Anesthesia Preprocedure Evaluation (Addendum)
Anesthesia Evaluation  Patient identified by MRN, date of birth, ID band Patient awake    Reviewed: Allergy & Precautions, NPO status , Patient's Chart, lab work & pertinent test results  Airway Mallampati: II  TM Distance: >3 FB Neck ROM: Full    Dental no notable dental hx.    Pulmonary neg pulmonary ROS, former smoker,    Pulmonary exam normal breath sounds clear to auscultation       Cardiovascular hypertension, Normal cardiovascular exam Rhythm:Regular Rate:Normal     Neuro/Psych negative neurological ROS  negative psych ROS   GI/Hepatic negative GI ROS, Neg liver ROS,   Endo/Other  diabetes  Renal/GU negative Renal ROS  negative genitourinary   Musculoskeletal negative musculoskeletal ROS (+)   Abdominal   Peds negative pediatric ROS (+)  Hematology negative hematology ROS (+)   Anesthesia Other Findings   Reproductive/Obstetrics negative OB ROS                            Anesthesia Physical Anesthesia Plan  ASA: III  Anesthesia Plan: General   Post-op Pain Management:    Induction: Intravenous and Rapid sequence  PONV Risk Score and Plan: 3 and Ondansetron, Dexamethasone, Treatment may vary due to age or medical condition and Scopolamine patch - Pre-op  Airway Management Planned: Oral ETT  Additional Equipment:   Intra-op Plan:   Post-operative Plan: Extubation in OR  Informed Consent: I have reviewed the patients History and Physical, chart, labs and discussed the procedure including the risks, benefits and alternatives for the proposed anesthesia with the patient or authorized representative who has indicated his/her understanding and acceptance.     Dental advisory given  Plan Discussed with: CRNA and Surgeon  Anesthesia Plan Comments: (See PAT note 08/13/2018, Konrad Felix, PA-C)       Anesthesia Quick Evaluation

## 2018-08-16 NOTE — Progress Notes (Signed)
Anesthesia Chart Review   Case: [id number] Date/Time: 08/17/18 1115   Procedures:      LAPAROSCOPY DIAGNOSTIC (N/A )     POSSIBLE EXPLORATORY LAPAROTOMY (N/A )     POSSIBLE BOWEL RESECTION (N/A )   Anesthesia type: General   Pre-op diagnosis: recurrent small bowel obstruction   Location: [facility] ROOM 01 / [facility]   Surgeon: Greer Pickerel, MD      DISCUSSION:58 y.o. former smoker with h/o HTN, DM II (last A1C 7.4 on 08/04/2018), HLD, depression, recurrent small bowel obstruction scheduled for above procedure 08/17/2018 with Dr. Greer Pickerel.   Pt with recent hospital admission 7/7-7/10/2018 with partial bowel obstruction.  Treated with conservative management, stable at discharge with outpatient referral to bariatric surgery.   Potassium 5.6 at PAT visit 08/13/2018.  Dr. Redmond Pulling made aware.  Will repeat BMP DOS.   Anticipate pt can proceed with planned procedure barring acute status change.   VS: BP 123/79   Pulse 86   Temp 36.7 C (Oral)   Resp 16   Ht 5\' 7"  (1.702 m)   Wt 82.1 kg   SpO2 99%   BMI 28.35 kg/m   PROVIDERS: Patient, No Pcp Per   LABS: Elevated Potassium, Dr. Redmond Pulling made aware, repeat BMP DOS (all labs ordered are listed, but only abnormal results are displayed)  Labs Reviewed  GLUCOSE, CAPILLARY - Abnormal; Notable for the following components:      Result Value   Glucose-Capillary 133 (*)    All other components within normal limits  COMPREHENSIVE METABOLIC PANEL - Abnormal; Notable for the following components:   Potassium 5.6 (*)    Glucose, Bld 139 (*)    All other components within normal limits  CBC WITH DIFFERENTIAL/PLATELET  TYPE AND SCREEN     IMAGES:   EKG: 08/04/2018 Rate 100bpm Normal sinus rhythm  Left axis deviation  Prolonged QT  Abnormal ECG   CV:  Past Medical History:  Diagnosis Date  . Bowel obstruction ([place])   . Depression   . Diabetes mellitus   . Hyperlipidemia   . Hypertension     Past Surgical History:  Procedure  Laterality Date  . gastric by pass    . HERNIA REPAIR    . OVARIAN CYST REMOVAL    . tonsillectomhy      MEDICATIONS: . atorvastatin (LIPITOR) 10 MG tablet  . Exenatide ER (BYDUREON BCISE) 2 MG/0.85ML AUIJ  . lisinopril-hydrochlorothiazide (ZESTORETIC) 20-12.5 MG tablet  . metFORMIN (GLUCOPHAGE-XR) 500 MG 24 hr tablet  . Multiple Vitamins-Iron (ONE-TABLET-DAILY/IRON PO)  . Multiple Vitamins-Minerals (WOMENS MULTI VITAMIN & MINERAL) TABS  . vitamin E 400 UNIT capsule   No current facility-administered medications for this encounter.    Derrill Memo ON 08/17/2018] bupivacaine liposome (EXPAREL) 1.3 % injection 266 mg    Maia Plan [facility] Pre-Surgical Testing [phone] 08/16/18  2:31 PM

## 2018-08-17 ENCOUNTER — Encounter (HOSPITAL_COMMUNITY)
Admission: RE | Disposition: A | Discharge status: Home / Self Care | Payer: Self-pay | Source: Ambulatory Visit | Attending: General Surgery

## 2018-08-17 ENCOUNTER — Ambulatory Visit (HOSPITAL_COMMUNITY): Payer: Managed Care, Other (non HMO) | Admitting: Certified Registered"

## 2018-08-17 ENCOUNTER — Ambulatory Visit (HOSPITAL_COMMUNITY)
Admission: RE | Admit: 2018-08-17 | Discharge: 2018-08-17 | Disposition: A | Discharge status: Home / Self Care | Payer: Managed Care, Other (non HMO) | Source: Ambulatory Visit | Attending: General Surgery | Admitting: General Surgery

## 2018-08-17 ENCOUNTER — Encounter (HOSPITAL_COMMUNITY): Payer: Self-pay

## 2018-08-17 DIAGNOSIS — Z79899 Other long term (current) drug therapy: Secondary | ICD-10-CM | POA: Insufficient documentation

## 2018-08-17 DIAGNOSIS — Z87891 Personal history of nicotine dependence: Secondary | ICD-10-CM | POA: Insufficient documentation

## 2018-08-17 DIAGNOSIS — E7800 Pure hypercholesterolemia, unspecified: Secondary | ICD-10-CM | POA: Diagnosis not present

## 2018-08-17 DIAGNOSIS — I10 Essential (primary) hypertension: Secondary | ICD-10-CM | POA: Insufficient documentation

## 2018-08-17 DIAGNOSIS — K56600 Partial intestinal obstruction, unspecified as to cause: Secondary | ICD-10-CM | POA: Diagnosis present

## 2018-08-17 DIAGNOSIS — E119 Type 2 diabetes mellitus without complications: Secondary | ICD-10-CM | POA: Diagnosis not present

## 2018-08-17 DIAGNOSIS — F329 Major depressive disorder, single episode, unspecified: Secondary | ICD-10-CM | POA: Insufficient documentation

## 2018-08-17 DIAGNOSIS — K5651 Intestinal adhesions [bands], with partial obstruction: Secondary | ICD-10-CM | POA: Insufficient documentation

## 2018-08-17 DIAGNOSIS — Z7984 Long term (current) use of oral hypoglycemic drugs: Secondary | ICD-10-CM | POA: Diagnosis not present

## 2018-08-17 DIAGNOSIS — Z9884 Bariatric surgery status: Secondary | ICD-10-CM | POA: Diagnosis not present

## 2018-08-17 HISTORY — PX: LAPAROSCOPY: SHX197

## 2018-08-17 LAB — BASIC METABOLIC PANEL
Anion gap: 12 (ref 5–15)
BUN: 11 mg/dL (ref 6–20)
CO2: 23 mmol/L (ref 22–32)
Calcium: 9.3 mg/dL (ref 8.9–10.3)
Chloride: 100 mmol/L (ref 98–111)
Creatinine, Ser: 0.67 mg/dL (ref 0.44–1.00)
GFR calc Af Amer: >60 mL/min (ref >60)
GFR calc non Af Amer: >60 mL/min (ref >60)
Glucose, Bld: 144 mg/dL — ABNORMAL HIGH (ref 70–99)
Potassium: 4.5 mmol/L (ref 3.5–5.1)
Sodium: 135 mmol/L (ref 135–145)

## 2018-08-17 LAB — GLUCOSE, CAPILLARY
Glucose-Capillary: 151 mg/dL — ABNORMAL HIGH (ref 70–99)
Glucose-Capillary: 181 mg/dL — ABNORMAL HIGH (ref 70–99)

## 2018-08-17 LAB — TYPE AND SCREEN
ABO/RH(D): A POS
Antibody Screen: NEGATIVE

## 2018-08-17 SURGERY — LAPAROSCOPY, DIAGNOSTIC
Anesthesia: General | Site: Abdomen

## 2018-08-17 MED ORDER — HYDROMORPHONE HCL 1 MG/ML IJ SOLN
0.2500 mg | INTRAMUSCULAR | Status: DC | PRN
  Administered 2018-08-17 (×2): 0.5 mg via INTRAVENOUS

## 2018-08-17 MED ORDER — PHENYLEPHRINE 40 MCG/ML (10ML) SYRINGE FOR IV PUSH (FOR BLOOD PRESSURE SUPPORT)
PREFILLED_SYRINGE | INTRAVENOUS | Status: AC
  Filled 2018-08-17: qty 10

## 2018-08-17 MED ORDER — LACTATED RINGERS IV SOLN
INTRAVENOUS | Status: DC
  Administered 2018-08-17 (×2): via INTRAVENOUS

## 2018-08-17 MED ORDER — PROMETHAZINE HCL 25 MG/ML IJ SOLN: 6.2500 mg | INTRAMUSCULAR | Status: DC | PRN

## 2018-08-17 MED ORDER — SODIUM CHLORIDE 0.9 % IV SOLN
2.0000 g | INTRAVENOUS | Status: AC
  Administered 2018-08-17: 2 g via INTRAVENOUS
  Filled 2018-08-17: qty 2

## 2018-08-17 MED ORDER — KETAMINE HCL 10 MG/ML IJ SOLN
INTRAMUSCULAR | Status: DC | PRN
  Administered 2018-08-17: 15 mg via INTRAVENOUS

## 2018-08-17 MED ORDER — SODIUM CHLORIDE 0.9 % IV SOLN
INTRAVENOUS | Status: DC | PRN
  Administered 2018-08-17: 12:00:00 30 ug/min via INTRAVENOUS

## 2018-08-17 MED ORDER — BUPIVACAINE-EPINEPHRINE (PF) 0.5% -1:200000 IJ SOLN
INTRAMUSCULAR | Status: DC | PRN
  Administered 2018-08-17: 30 mL

## 2018-08-17 MED ORDER — PHENYLEPHRINE 40 MCG/ML (10ML) SYRINGE FOR IV PUSH (FOR BLOOD PRESSURE SUPPORT)
PREFILLED_SYRINGE | INTRAVENOUS | Status: DC | PRN
  Administered 2018-08-17 (×3): 40 ug via INTRAVENOUS

## 2018-08-17 MED ORDER — LIDOCAINE 2% (20 MG/ML) 5 ML SYRINGE
INTRAMUSCULAR | Status: DC | PRN
  Administered 2018-08-17: 1.5 mg/kg/h via INTRAVENOUS

## 2018-08-17 MED ORDER — LIDOCAINE 2% (20 MG/ML) 5 ML SYRINGE
INTRAMUSCULAR | Status: AC
  Filled 2018-08-17: qty 5

## 2018-08-17 MED ORDER — PROPOFOL 10 MG/ML IV BOLUS
INTRAVENOUS | Status: DC | PRN
  Administered 2018-08-17: 130 mg via INTRAVENOUS

## 2018-08-17 MED ORDER — ACETAMINOPHEN 500 MG PO TABS
1000.0000 mg | ORAL_TABLET | ORAL | Status: AC
  Administered 2018-08-17: 1000 mg via ORAL
  Filled 2018-08-17: qty 2

## 2018-08-17 MED ORDER — DEXAMETHASONE SODIUM PHOSPHATE 10 MG/ML IJ SOLN
INTRAMUSCULAR | Status: DC | PRN
  Administered 2018-08-17: 4 mg via INTRAVENOUS

## 2018-08-17 MED ORDER — 0.9 % SODIUM CHLORIDE (POUR BTL) OPTIME
TOPICAL | Status: DC | PRN
  Administered 2018-08-17: 2000 mL

## 2018-08-17 MED ORDER — FENTANYL CITRATE (PF) 250 MCG/5ML IJ SOLN
INTRAMUSCULAR | Status: DC | PRN
  Administered 2018-08-17: 100 ug via INTRAVENOUS

## 2018-08-17 MED ORDER — TRAMADOL HCL 50 MG PO TABS: 50.0000 mg | ORAL_TABLET | Freq: Four times a day (QID) | ORAL | 0 refills | Status: DC | PRN

## 2018-08-17 MED ORDER — LIDOCAINE 2% (20 MG/ML) 5 ML SYRINGE
INTRAMUSCULAR | Status: DC | PRN
  Administered 2018-08-17: 60 mg via INTRAVENOUS

## 2018-08-17 MED ORDER — MIDAZOLAM HCL 2 MG/2ML IJ SOLN
INTRAMUSCULAR | Status: AC
  Filled 2018-08-17: qty 2

## 2018-08-17 MED ORDER — ROCURONIUM BROMIDE 10 MG/ML (PF) SYRINGE
PREFILLED_SYRINGE | INTRAVENOUS | Status: DC | PRN
  Administered 2018-08-17 (×2): 10 mg via INTRAVENOUS
  Administered 2018-08-17: 40 mg via INTRAVENOUS

## 2018-08-17 MED ORDER — KETAMINE HCL 10 MG/ML IJ SOLN
INTRAMUSCULAR | Status: AC
  Filled 2018-08-17: qty 1

## 2018-08-17 MED ORDER — SUGAMMADEX SODIUM 200 MG/2ML IV SOLN
INTRAVENOUS | Status: DC | PRN
  Administered 2018-08-17: 200 mg via INTRAVENOUS

## 2018-08-17 MED ORDER — HEPARIN SODIUM (PORCINE) 5000 UNIT/ML IJ SOLN
5000.0000 [IU] | Freq: Once | INTRAMUSCULAR | Status: AC
  Administered 2018-08-17: 5000 [IU] via SUBCUTANEOUS
  Filled 2018-08-17: qty 1

## 2018-08-17 MED ORDER — DEXAMETHASONE SODIUM PHOSPHATE 4 MG/ML IJ SOLN: 4.0000 mg | INTRAMUSCULAR | Status: DC

## 2018-08-17 MED ORDER — ROCURONIUM BROMIDE 10 MG/ML (PF) SYRINGE
PREFILLED_SYRINGE | INTRAVENOUS | Status: AC
  Filled 2018-08-17: qty 10

## 2018-08-17 MED ORDER — GABAPENTIN 300 MG PO CAPS
300.0000 mg | ORAL_CAPSULE | ORAL | Status: AC
  Administered 2018-08-17: 300 mg via ORAL
  Filled 2018-08-17: qty 1

## 2018-08-17 MED ORDER — KETOROLAC TROMETHAMINE 30 MG/ML IJ SOLN: 30.0000 mg | Freq: Once | INTRAMUSCULAR | Status: DC | PRN

## 2018-08-17 MED ORDER — SCOPOLAMINE 1 MG/3DAYS TD PT72
1.0000 | MEDICATED_PATCH | TRANSDERMAL | Status: AC
  Administered 2018-08-17: 1 via TRANSDERMAL
  Administered 2018-08-17: 1.5 mg via TRANSDERMAL
  Filled 2018-08-17: qty 1

## 2018-08-17 MED ORDER — PHENYLEPHRINE HCL (PRESSORS) 10 MG/ML IV SOLN
INTRAVENOUS | Status: AC
  Filled 2018-08-17: qty 1

## 2018-08-17 MED ORDER — HYDROMORPHONE HCL 1 MG/ML IJ SOLN
INTRAMUSCULAR | Status: AC
  Administered 2018-08-17: 0.5 mg via INTRAVENOUS
  Filled 2018-08-17: qty 1

## 2018-08-17 MED ORDER — FENTANYL CITRATE (PF) 250 MCG/5ML IJ SOLN
INTRAMUSCULAR | Status: AC
  Filled 2018-08-17: qty 5

## 2018-08-17 MED ORDER — ONDANSETRON HCL 4 MG/2ML IJ SOLN
INTRAMUSCULAR | Status: DC | PRN
  Administered 2018-08-17: 4 mg via INTRAVENOUS

## 2018-08-17 MED ORDER — PROPOFOL 10 MG/ML IV BOLUS
INTRAVENOUS | Status: AC
  Filled 2018-08-17: qty 20

## 2018-08-17 MED ORDER — MIDAZOLAM HCL 2 MG/2ML IJ SOLN
INTRAMUSCULAR | Status: DC | PRN
  Administered 2018-08-17: 2 mg via INTRAVENOUS

## 2018-08-17 MED ORDER — SUGAMMADEX SODIUM 200 MG/2ML IV SOLN
INTRAVENOUS | Status: AC
  Filled 2018-08-17: qty 2

## 2018-08-17 MED ORDER — ONDANSETRON HCL 4 MG/2ML IJ SOLN
INTRAMUSCULAR | Status: AC
  Filled 2018-08-17: qty 2

## 2018-08-17 MED ORDER — BUPIVACAINE-EPINEPHRINE 0.5% -1:200000 IJ SOLN
INTRAMUSCULAR | Status: AC
  Filled 2018-08-17: qty 1

## 2018-08-17 MED ORDER — BUPIVACAINE LIPOSOME 1.3 % IJ SUSP
INTRAMUSCULAR | Status: DC | PRN
  Administered 2018-08-17: 20 mL

## 2018-08-17 MED ORDER — SUCCINYLCHOLINE CHLORIDE 200 MG/10ML IV SOSY
PREFILLED_SYRINGE | INTRAVENOUS | Status: DC | PRN
Start: 2018-08-17 — End: 2018-08-17
  Administered 2018-08-17: 100 mg via INTRAVENOUS

## 2018-08-17 MED ORDER — DEXAMETHASONE SODIUM PHOSPHATE 10 MG/ML IJ SOLN
INTRAMUSCULAR | Status: AC
  Filled 2018-08-17: qty 1

## 2018-08-17 SURGICAL SUPPLY — 40 items
ADH SKN CLS APL DERMABOND .7 (GAUZE/BANDAGES/DRESSINGS) ×2
APL PRP STRL LF DISP 70% ISPRP (MISCELLANEOUS) ×2
BLADE EXTENDED COATED 6.5IN (ELECTRODE) ×3 IMPLANT
CABLE HIGH FREQUENCY MONO STRZ (ELECTRODE) ×1 IMPLANT
CHLORAPREP W/TINT 26 (MISCELLANEOUS) ×3 IMPLANT
COVER SURGICAL LIGHT HANDLE (MISCELLANEOUS) ×3 IMPLANT
DECANTER SPIKE VIAL GLASS SM (MISCELLANEOUS) ×3 IMPLANT
DERMABOND ADVANCED (GAUZE/BANDAGES/DRESSINGS) ×1
DERMABOND ADVANCED .7 DNX12 (GAUZE/BANDAGES/DRESSINGS) IMPLANT
DRAPE WARM FLUID 44X44 (DRAPES) ×1 IMPLANT
DRSG OPSITE POSTOP 4X6 (GAUZE/BANDAGES/DRESSINGS) IMPLANT
DRSG OPSITE POSTOP 4X8 (GAUZE/BANDAGES/DRESSINGS) IMPLANT
ELECT REM PT RETURN 15FT ADLT (MISCELLANEOUS) ×3 IMPLANT
EVACUATOR SILICONE 100CC (DRAIN) ×3 IMPLANT
GAUZE SPONGE 4X4 12PLY STRL (GAUZE/BANDAGES/DRESSINGS) ×3 IMPLANT
GLOVE BIO SURGEON STRL SZ7.5 (GLOVE) ×6 IMPLANT
GLOVE BIOGEL M STRL SZ7.5 (GLOVE) ×3 IMPLANT
GLOVE BIOGEL PI IND STRL 7.0 (GLOVE) ×2 IMPLANT
GLOVE BIOGEL PI INDICATOR 7.0 (GLOVE) ×4
GLOVE ECLIPSE 6.5 STRL STRAW (GLOVE) ×1 IMPLANT
GLOVE INDICATOR 8.0 STRL GRN (GLOVE) ×6 IMPLANT
GLOVE SURG SS PI 7.0 STRL IVOR (GLOVE) ×2 IMPLANT
GOWN STRL REUS W/TWL XL LVL3 (GOWN DISPOSABLE) ×18 IMPLANT
KIT BASIN OR (CUSTOM PROCEDURE TRAY) ×3 IMPLANT
KIT TURNOVER KIT A (KITS) IMPLANT
PACK GENERAL/GYN (CUSTOM PROCEDURE TRAY) ×3 IMPLANT
SCISSORS LAP 5X35 DISP (ENDOMECHANICALS) ×3 IMPLANT
SLEEVE XCEL OPT CAN 5 100 (ENDOMECHANICALS) ×5 IMPLANT
STRIP CLOSURE SKIN 1/2X4 (GAUZE/BANDAGES/DRESSINGS) IMPLANT
SUT PDS AB 1 CTX 36 (SUTURE) IMPLANT
SUT PDS AB 3-0 SH 27 (SUTURE) ×3 IMPLANT
SUT PDS AB 4-0 SH 27 (SUTURE) IMPLANT
SUT PROLENE 2 0 BLUE (SUTURE) IMPLANT
SUT SILK 2 0 (SUTURE)
SUT SILK 2-0 30XBRD TIE 12 (SUTURE) IMPLANT
TOWEL OR 17X26 10 PK STRL BLUE (TOWEL DISPOSABLE) ×3 IMPLANT
TOWEL OR NON WOVEN STRL DISP B (DISPOSABLE) ×3 IMPLANT
TRAY FOLEY MTR SLVR 14FR STAT (SET/KITS/TRAYS/PACK) ×1 IMPLANT
TRAY LAPAROSCOPIC (CUSTOM PROCEDURE TRAY) ×3 IMPLANT
TROCAR BLADELESS OPT 5 100 (ENDOMECHANICALS) ×3 IMPLANT

## 2018-08-17 NOTE — Anesthesia Procedure Notes (Signed)
Procedure Name: Intubation Date/Time: 08/17/2018 11:19 AM Performed by: Eben Burow, CRNA Pre-anesthesia Checklist: Patient identified, Emergency Drugs available, Suction available, Patient being monitored and Timeout performed Patient Re-evaluated:Patient Re-evaluated prior to induction Oxygen Delivery Method: Circle system utilized Preoxygenation: Pre-oxygenation with 100% oxygen Induction Type: IV induction and Rapid sequence Laryngoscope Size: Mac and 4 Grade View: Grade I Tube type: Oral Tube size: 7.0 mm Number of attempts: 1 Airway Equipment and Method: Stylet Placement Confirmation: ETT inserted through vocal cords under direct vision,  positive ETCO2 and breath sounds checked- equal and bilateral Secured at: 21 cm Tube secured with: Tape Dental Injury: Teeth and Oropharynx as per pre-operative assessment

## 2018-08-17 NOTE — Transfer of Care (Signed)
Immediate Anesthesia Transfer of Care Note  Patient: ANESSIA OAKLAND  Procedure(s) Performed: LAPAROSCOPY DIAGNOSTIC (N/A Abdomen)  Patient Location: PACU  Anesthesia Type:General  Level of Consciousness: awake, alert  and oriented  Airway & Oxygen Therapy: Patient Spontanous Breathing and Patient connected to face mask oxygen  Post-op Assessment: Report given to RN and Post -op Vital signs reviewed and stable  Post vital signs: Reviewed, stable  Last Vitals:  Vitals Value Taken Time  BP 132/83 08/17/18 1230  Temp    Pulse 108 08/17/18 1232  Resp 15 08/17/18 1232  SpO2 100 % 08/17/18 1232  Vitals shown include unvalidated device data.  Last Pain:  Vitals:   08/17/18 1024  TempSrc: Oral  PainSc:          Complications: No apparent anesthesia complications

## 2018-08-17 NOTE — Anesthesia Postprocedure Evaluation (Signed)
Anesthesia Post Note  Patient: Ashlee Thomas  Procedure(s) Performed: LAPAROSCOPY DIAGNOSTIC (N/A Abdomen)     Patient location during evaluation: PACU Anesthesia Type: General Level of consciousness: awake and alert Pain management: pain level controlled Vital Signs Assessment: post-procedure vital signs reviewed and stable Respiratory status: spontaneous breathing, nonlabored ventilation, respiratory function stable and patient connected to nasal cannula oxygen Cardiovascular status: blood pressure returned to baseline and stable Postop Assessment: no apparent nausea or vomiting Anesthetic complications: no    Last Vitals:  Vitals:   08/17/18 1300 08/17/18 1315  BP: 117/79 (!) 126/103  Pulse: 95 94  Resp: 12 14  Temp:  (!) 36.4 C  SpO2: 100% 100%    Last Pain:  Vitals:   08/17/18 1315  TempSrc:   PainSc: 8                  Jonai Weyland S

## 2018-08-17 NOTE — Op Note (Addendum)
08/17/2018  12:36 PM  PATIENT:  Ashlee Thomas  58 y.o. female  PRE-OPERATIVE DIAGNOSIS:  recurrent small bowel obstruction, h/o laparoscopic roux en y gastric 2011, h/o laparoscopic incisional hernia repairs x 2   POST-OPERATIVE DIAGNOSIS: same + adhesive bands  PROCEDURE:  Procedure(s): LAPAROSCOPY DIAGNOSTIC, laparoscopic lysis of adhesions x 30 min  SURGEON:  Surgeon(s): Atilano InaEric M Akaila Rambo MD FACS   ASSISTANTS: Luretha MurphyMatthew Martin MD FACS (An assistant was requested due to the patient's extensive abdominal surgical history and the need for possible assistance with lysis of adhesions and possible bowel resection)  ANESTHESIA:   general  DRAINS: none   LOCAL MEDICATIONS USED:  MARCAINE    and OTHER Exparel  SPECIMEN:  Source of Specimen:  adhesive band  DISPOSITION OF SPECIMEN:  discarded  EBL: minimal   COUNTS:  YES  INDICATION FOR PROCEDURE: This a very pleasant 58 year old female who underwent a laparoscopic Roux-en-Y gastric bypass in 2011 by Dr. Johna SheriffHoxworth. She had previously undergone an open ovarian cystectomy many years prior.  She states that a few years after her Roux-en-Y gastric bypass she developed a bulge in her lower abdomen.  She states that she underwent a laparoscopic hernia repair.  She states that it recurred and she underwent a recurrent laparoscopic repair.  This was apparently all done in [place] and I do not have a copy the operative notes.  The patient relates multiple year history of chronic intermittent crampy abdominal pain which is not that bad; however, recently over the past 6 weeks she has had several episodes of severe abdominal pain prompting her to be admitted to 3 different hospitals.  The first 1 [place] in [place] state [place], the second episode was at [facility] [place].  Both instances the CT scan showed a transition point in the right mid abdomen near the pelvis.  She resolved without surgical intervention.  She had a third  hospitalization about a week and a half ago here in [place] with a CT scan showing the same finding.  Because she has had ongoing episodes of severe abdominal pain prompting brief admissions for small bowel obstructions with increasing frequency we discussed diagnostic laparoscopy possible exploratory laparotomy, lysis of adhesions, possible bowel resection.  Please see her outside chart for additional details  PROCEDURE: Preoperatively the patient was given 5000 units of subcutaneous heparin, oral Tylenol and gabapentin.  She was taken to operating room 1 at [facility] placed supine on the operating room table.  General endotracheal anesthesia was established.  Sequential compression devices were placed.  A Foley catheter was placed.  Her left arm was tucked by her side with the appropriate padding.  She received IV antibiotic prior to skin incision.  A surgical timeout was performed.  I decided to use Optiview technique in the left upper quadrant through an old trocar site.  A small incision was made just below the left subcostal margin in an old trocar site.  Then using a 5 mm 0 degree laparoscope through a 5 mm trocar I advanced it through all layers of the abdominal wall under direct visualization and entered the abdominal cavity.  Pneumoperitoneum was smoothly established up to a patient pressure 15 mmHg without any change in patient vital signs.    The abdominal cavity was surveilled.  There was no evidence of injury to surrounding structures.  She had well incorporated mesh into her abdominal wall in the lower midline and the lower abdomen.  There is a  loop of distal small bowel densely adhered to the mesh in the lower midline.  Otherwise there were no adhesions to the anterior abdominal wall.  2 additional 5 mm trochars were placed in the left lateral abdominal wall under direct visualization.  An additional 5 mm trocar was placed in the right midabdomen under direct visualization.  I  decided to start running her small bowel at the terminal ileum and cecum.  The patient had a prior appendectomy.  The terminal ileum was identified and the bowel was ran proximally using atraumatic bowel graspers.  This section of the small bowel was decompressed.  We followed the small bowel up until it was adhered to the mesh and were able to identify the bowel proximally to this area and continue to run proximally.  There is an intraloop adhesive band near this area in the right midabdomen that appeared to be a potential source for an internal hernia.  This was lysed with EndoShears without electrocautery.  I continue to run the bowel more proximally.  It should be noted that there were several segments of small bowel where the loops were folded onto itself creating several areas of chronic dilatation but it did not appear to be the source of obstruction.  As we ran the bowel more proximally I came across an adhesive band that was running from the left upper quadrant to the right lower quadrant.  It was going underneath a loop of bowel.  I ended up removing this adhesive band with EndoShears with electrocautery.  This band was very long and it was brought out through the trocar and discarded.  I came across her JJ anastomosis which appeared chronically dilated which is not atypical.  We then identified the Roux limb going up to the gastric pouch and she had a fairly long candycane (blind end) of the Roux limb but did not appear chronically dilated.  I then traced the Roux limb back down toward the jejunojejunostomy.  There did not appear to be any classic signs of mesenteric defects seen after laparoscopic Roux-en-Y gastric bypass.  At this point we felt that we had taken down the adhesive band that was causing the severe episodes of abdominal pain resulting in admissions and the area seen on CT imaging.  A bilateral Exparel Marcaine tap block was placed in the lateral abdominal walls under direct laparoscopic  visualization.  At this point I felt that we had achieved today's objectives.  I did not feel that the loop of small bowel densely adhered to the mesh was the source of her episodes of admission.  The other 1-2 areas of small bowel dilatation that appeared chronic were also left alone.  If the patient has ongoing severe abdominal episodes then I am not sure for laparoscopic approach will be doable because she would need an extensive adhesio lysis.  Pneumoperitoneum was released.  Trochars were removed.  Skin incision closed with 4-0 Monocryl in a subcuticular fashion followed by the application of Dermabond.  All needle instrument counts were correct x2 there were no immediate complications.  The catheter was removed.  Patient taken to recovery room in stable condition  Intraop laparoscopic photos taken and sent to [place]: Discharge to home after PACU  PATIENT DISPOSITION:  PACU - hemodynamically stable.   Delay start of Pharmacological VTE agent (>24hrs) due to surgical blood loss or risk of bleeding:  not applicable  Leighton Ruff. Redmond Pulling, MD, FACS General, Bariatric, & Minimally Invasive Surgery  [facility] [place]

## 2018-08-17 NOTE — Interval H&P Note (Signed)
History and Physical Interval Note:  08/17/2018 10:54 AM  Ashlee Thomas  has presented today for surgery, with the diagnosis of recurrent small bowel obstruction.  The various methods of treatment have been discussed with the patient and family. After consideration of risks, benefits and other options for treatment, the patient has consented to  Procedure(s): LAPAROSCOPY DIAGNOSTIC (N/A) POSSIBLE EXPLORATORY LAPAROTOMY (N/A) POSSIBLE BOWEL RESECTION (N/A) as a surgical intervention.  The patient's history has been reviewed, patient examined, no change in status, stable for surgery.  I have reviewed the patient's chart and labs.  Questions were answered to the patient's satisfaction.    Leighton Ruff. Redmond Pulling, MD, FACS General, Bariatric, & Minimally Invasive Surgery [facility] [place]  [place]

## 2018-08-17 NOTE — Discharge Instructions (Signed)
[facility] LAPAROSCOPIC SURGERY: POST OP INSTRUCTIONS Always review your discharge instruction sheet given to you by the facility where your surgery was performed. IF YOU HAVE DISABILITY OR FAMILY LEAVE FORMS, YOU MUST BRING THEM TO THE OFFICE FOR PROCESSING.   DO NOT GIVE THEM TO YOUR DOCTOR.  PAIN CONTROL  1. First take acetaminophen (Tylenol)  to control your pain after surgery.  Follow directions on package.  Taking acetaminophen (Tylenol)  regularly after surgery will help to control your pain and lower the amount of prescription pain medication you may need.  You should not take more than 3,000 mg (3 grams) of acetaminophen (Tylenol) in 24 hours.  You should not take ibuprofen (Advil), aleve, motrin, naprosyn or other NSAIDS if you have a history of stomach ulcers or chronic kidney disease or gastric bypass 2. A prescription for pain medication may be given to you upon discharge.  Take your pain medication as prescribed, if you still have uncontrolled pain after taking acetaminophen (Tylenol) or ibuprofen (Advil). 3. Use ice packs to help control pain. 4. If you need a refill on your pain medication, please contact your pharmacy.  They will contact our office to request authorization. Prescriptions will not be filled after 5pm or on week-ends.  HOME MEDICATIONS 5. Take your usually prescribed medications unless otherwise directed.  DIET 6. You should follow a light diet the first few days after arrival home.  Be sure to include lots of fluids daily. Avoid fatty, fried foods.   CONSTIPATION 7. It is common to experience some constipation after surgery and if you are taking pain medication.  Increasing fluid intake and taking a stool softener (such as Colace) will usually help or prevent this problem from occurring.  A mild laxative (Milk of Magnesia or Miralax) should be taken according to package instructions if there are no bowel movements after 48  hours.  WOUND/INCISION CARE 8. Most patients will experience some swelling and bruising in the area of the incisions.  Ice packs will help.  Swelling and bruising can take several days to resolve.  9. Unless discharge instructions indicate otherwise, follow guidelines below  a. STERI-STRIPS - you may remove your outer bandages 48 hours after surgery, and you may shower at that time.  You have steri-strips (small skin tapes) in place directly over the incision.  These strips should be left on the skin for 7-10 days.   b. DERMABOND/SKIN GLUE - you may shower in 24 hours.  The glue will flake off over the next 2-3 weeks. 10. Any sutures or staples will be removed at the office during your follow-up visit.  ACTIVITIES 11. You may resume regular (light) daily activities beginning the next day--such as daily self-care, walking, climbing stairs--gradually increasing activities as tolerated.  You may have sexual intercourse when it is comfortable.  Refrain from any heavy lifting or straining until approved by your doctor. a. You may drive when you are no longer taking prescription pain medication, you can comfortably wear a seatbelt, and you can safely maneuver your car and apply brakes.  FOLLOW-UP 12. You should see your doctor in the office for a follow-up appointment approximately 2-3 weeks after your surgery.  You should have been given your post-op/follow-up appointment when your surgery was scheduled.  If you did not receive a post-op/follow-up appointment, make sure that you call for this appointment within a day or two after you arrive home to insure a convenient appointment time.  OTHER INSTRUCTIONS 13.  WHEN TO CALL YOUR DOCTOR: 1. Fever over 101.0 2. Inability to urinate 3. Continued bleeding from incision. 4. Increased pain, redness, or drainage from the incision. 5. Increasing abdominal pain  The clinic staff is available to answer your questions during regular business hours.  Please  dont hesitate to call and ask to speak to one of the nurses for clinical concerns.  If you have a medical emergency, go to the nearest emergency room or call 911.  A surgeon from [facility] is always on call at the hospital. [address], [place], [place], [place]  [address] ? [address], [place],    [address] [phone] ? [phone] ? FAX [phone] Web site: [email or web address]

## 2018-08-18 ENCOUNTER — Encounter (HOSPITAL_COMMUNITY): Payer: Self-pay | Admitting: General Surgery

## 2020-03-06 ENCOUNTER — Emergency Department (HOSPITAL_COMMUNITY): Payer: BC Managed Care – PPO

## 2020-03-06 ENCOUNTER — Other Ambulatory Visit: Payer: Self-pay

## 2020-03-06 ENCOUNTER — Emergency Department (HOSPITAL_COMMUNITY)
Admission: EM | Admit: 2020-03-06 | Discharge: 2020-03-06 | Disposition: A | Discharge status: Home / Self Care | Payer: BC Managed Care – PPO | Attending: Emergency Medicine | Admitting: Emergency Medicine

## 2020-03-06 ENCOUNTER — Encounter (HOSPITAL_COMMUNITY): Payer: Self-pay

## 2020-03-06 DIAGNOSIS — Z7984 Long term (current) use of oral hypoglycemic drugs: Secondary | ICD-10-CM | POA: Diagnosis not present

## 2020-03-06 DIAGNOSIS — K567 Ileus, unspecified: Secondary | ICD-10-CM

## 2020-03-06 DIAGNOSIS — I10 Essential (primary) hypertension: Secondary | ICD-10-CM | POA: Insufficient documentation

## 2020-03-06 DIAGNOSIS — E119 Type 2 diabetes mellitus without complications: Secondary | ICD-10-CM | POA: Diagnosis not present

## 2020-03-06 DIAGNOSIS — Z87891 Personal history of nicotine dependence: Secondary | ICD-10-CM | POA: Diagnosis not present

## 2020-03-06 DIAGNOSIS — Z20822 Contact with and (suspected) exposure to covid-19: Secondary | ICD-10-CM | POA: Diagnosis not present

## 2020-03-06 DIAGNOSIS — Z79899 Other long term (current) drug therapy: Secondary | ICD-10-CM | POA: Insufficient documentation

## 2020-03-06 DIAGNOSIS — K56609 Unspecified intestinal obstruction, unspecified as to partial versus complete obstruction: Secondary | ICD-10-CM | POA: Diagnosis present

## 2020-03-06 LAB — COMPREHENSIVE METABOLIC PANEL
ALT: 44 U/L (ref 0–44)
AST: 60 U/L — ABNORMAL HIGH (ref 15–41)
Albumin: 3.8 g/dL (ref 3.5–5.0)
Alkaline Phosphatase: 108 U/L (ref 38–126)
Anion gap: 10 (ref 5–15)
BUN: 20 mg/dL (ref 6–20)
CO2: 22 mmol/L (ref 22–32)
Calcium: 8.7 mg/dL — ABNORMAL LOW (ref 8.9–10.3)
Chloride: 105 mmol/L (ref 98–111)
Creatinine, Ser: 0.47 mg/dL (ref 0.44–1.00)
GFR, Estimated: >60 mL/min (ref >60)
Glucose, Bld: 151 mg/dL — ABNORMAL HIGH (ref 70–99)
Potassium: 3.6 mmol/L (ref 3.5–5.1)
Sodium: 137 mmol/L (ref 135–145)
Total Bilirubin: 0.7 mg/dL (ref 0.3–1.2)
Total Protein: 6.3 g/dL — ABNORMAL LOW (ref 6.5–8.1)

## 2020-03-06 LAB — CBC WITH DIFFERENTIAL/PLATELET
Abs Immature Granulocytes: 0.01 10*3/uL (ref 0.00–0.07)
Basophils Absolute: 0 10*3/uL (ref 0.0–0.1)
Basophils Relative: 1 %
Eosinophils Absolute: 0.1 10*3/uL (ref 0.0–0.5)
Eosinophils Relative: 1 %
HCT: 38.4 % (ref 36.0–46.0)
Hemoglobin: 12.7 g/dL (ref 12.0–15.0)
Immature Granulocytes: 0 %
Lymphocytes Relative: 14 %
Lymphs Abs: 1.1 10*3/uL (ref 0.7–4.0)
MCH: 30.5 pg (ref 26.0–34.0)
MCHC: 33.1 g/dL (ref 30.0–36.0)
MCV: 92.1 fL (ref 80.0–100.0)
Monocytes Absolute: 0.8 10*3/uL (ref 0.1–1.0)
Monocytes Relative: 10 %
Neutro Abs: 6 10*3/uL (ref 1.7–7.7)
Neutrophils Relative %: 74 %
Platelets: 240 10*3/uL (ref 150–400)
RBC: 4.17 MIL/uL (ref 3.87–5.11)
RDW: 13.2 % (ref 11.5–15.5)
WBC: 8.1 10*3/uL (ref 4.0–10.5)
nRBC: 0 % (ref 0.0–0.2)

## 2020-03-06 LAB — RESP PANEL BY RT-PCR (FLU A&B, COVID) ARPGX2
Influenza A by PCR: NEGATIVE
Influenza B by PCR: NEGATIVE
SARS Coronavirus 2 by RT PCR: NEGATIVE

## 2020-03-06 LAB — LACTIC ACID, PLASMA: Lactic Acid, Venous: 1 mmol/L (ref 0.5–1.9)

## 2020-03-06 LAB — LIPASE, BLOOD: Lipase: 29 U/L (ref 11–51)

## 2020-03-06 MED ORDER — IOHEXOL 9 MG/ML PO SOLN
500.0000 mL | ORAL | Status: AC
  Administered 2020-03-06 (×2): 500 mL via ORAL

## 2020-03-06 MED ORDER — IOHEXOL 300 MG/ML  SOLN
100.0000 mL | Freq: Once | INTRAMUSCULAR | Status: AC | PRN
  Administered 2020-03-06: 100 mL via INTRAVENOUS

## 2020-03-06 MED ORDER — IOHEXOL 9 MG/ML PO SOLN
ORAL | Status: AC
  Filled 2020-03-06: qty 1000

## 2020-03-06 MED ORDER — SODIUM CHLORIDE 0.9 % IV SOLN: INTRAVENOUS | Status: DC

## 2020-03-06 NOTE — H&P (Addendum)
CC: History of gastric bypass, possible bowel obstruction  Requesting provider: Dr. Freida Busman  HPI: Ashlee Thomas is an 60 y.o. female with hx of HTN, HLD, DM whom underwent RYGB with Dr. Johna Sheriff 2011. She had ventral hernia repair with mesh as well. Subsequently SBOs that she was taken to the OR for 08/17/2018 by one of my partners Dr. Andrey Campanile. She was found to have adhesive bands that were lysed and one segment of small bowel densely adherent to midline mesh. CTs up to that point suggested transition point in RLQ. She was having crampy chronic abdominal pains that were the indication for her diagnostic laparoscopy with Dr. Andrey Campanile.  She reports that yesterday evening she had acute onset of crampy abdominal pain associated with nausea/vomiting that she presented to [facility] in [place] for evaluation. 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  By report of the ER at [facility], they had spoken to their on call surgeon who didn't feel comfortable and our on call team was consulted. By report, of the on call ER doctor at [facility] this morning no other medical centers that were closer were willing to accept transfers. She ultimately arrived at our facilty early this afternoon.  She reports that approximately 1 hour after her CT was completed, she had complete resolution of all of her symptoms. She has been passing gas. She has had 0 abdominal pain or cramps or discomfort. She feels great.  Past Medical History:  Diagnosis Date  . Bowel obstruction (HCC)   . Depression   . Diabetes mellitus   . Hyperlipidemia   . Hypertension     Past Surgical History:  Procedure Laterality Date  . gastric by pass    . HERNIA REPAIR    . LAPAROSCOPY N/A 08/17/2018   Procedure: LAPAROSCOPY DIAGNOSTIC;  Surgeon:  Gaynelle Adu, MD;  Location: [facility]S;  Service: General;  Laterality: N/A;  . OVARIAN CYST REMOVAL    . tonsillectomhy      Family History  Problem Relation Age of Onset  . Diabetes Mother     Social:  reports that she has quit smoking. She has never used smokeless tobacco. She reports that she does not drink alcohol and does not use drugs.  Allergies: No Known Allergies  Medications: I have reviewed the patient's current medications.  Results for orders placed or performed during the hospital encounter of 03/06/20 (from the past 48 hour(s))  Comprehensive metabolic panel     Status: Abnormal   Collection Time: 03/06/20 12:56 PM  Result Value Ref Range   Sodium 137 135 - 145 mmol/L   Potassium 3.6 3.5 - 5.1 mmol/L   Chloride 105 98 - 111 mmol/L   CO2 22 22 - 32 mmol/L   Glucose, Bld 151 (H) 70 - 99 mg/dL    Comment: Glucose reference range applies only to samples taken after fasting for at least 8 hours.   BUN 20 6 - 20 mg/dL   Creatinine, Ser 1.96 0.44 - 1.00 mg/dL   Calcium 8.7 (L) 8.9 - 10.3 mg/dL   Total Protein 6.3 (L) 6.5 - 8.1 g/dL   Albumin 3.8 3.5 - 5.0 g/dL   AST 60 (H) 15 - 41 U/L   ALT 44 0 - 44 U/L   Alkaline Phosphatase 108 38 - 126 U/L  Total Bilirubin 0.7 0.3 - 1.2 mg/dL   GFR, Estimated >65 >46 mL/min    Comment: (NOTE) Calculated using the CKD-EPI Creatinine Equation (2021)    Anion gap 10 5 - 15    Comment: Performed at [facility], [address], [place], [place] [address]  Lipase, blood     Status: None   Collection Time: 03/06/20 12:56 PM  Result Value Ref Range   Lipase 29 11 - 51 U/L    Comment: Performed at [facility], [address] [place], [place] [address]  Lactic acid, plasma     Status: None   Collection Time: 03/06/20 12:58 PM  Result Value Ref Range   Lactic Acid, Venous 1.0 0.5 - 1.9 mmol/L    Comment: Performed at [facility], [address] [place], [place] [address]   CBC with Differential/Platelet     Status: None   Collection Time: 03/06/20  1:00 PM  Result Value Ref Range   WBC 8.1 4.0 - 10.5 K/uL   RBC 4.17 3.87 - 5.11 MIL/uL   Hemoglobin 12.7 12.0 - 15.0 g/dL   HCT 00.1 74.9 - 44.9 %   MCV 92.1 80.0 - 100.0 fL   MCH 30.5 26.0 - 34.0 pg   MCHC 33.1 30.0 - 36.0 g/dL   RDW 67.5 91.6 - 38.4 %   Platelets 240 150 - 400 K/uL   nRBC 0.0 0.0 - 0.2 %   Neutrophils Relative % 74 %   Neutro Abs 6.0 1.7 - 7.7 K/uL   Lymphocytes Relative 14 %   Lymphs Abs 1.1 0.7 - 4.0 K/uL   Monocytes Relative 10 %   Monocytes Absolute 0.8 0.1 - 1.0 K/uL   Eosinophils Relative 1 %   Eosinophils Absolute 0.1 0.0 - 0.5 K/uL   Basophils Relative 1 %   Basophils Absolute 0.0 0.0 - 0.1 K/uL   Immature Granulocytes 0 %   Abs Immature Granulocytes 0.01 0.00 - 0.07 K/uL    Comment: Performed at [facility], [address] [place], [place] [address]    No results found.  ROS - all of the below systems have been reviewed with the patient and positives are indicated with bold text General: chills, fever or night sweats Eyes: blurry vision or double vision ENT: epistaxis or sore throat Allergy/Immunology: itchy/watery eyes or nasal congestion Hematologic/Lymphatic: bleeding problems, blood clots or swollen lymph nodes Endocrine: temperature intolerance or unexpected weight changes Breast: new or changing breast lumps or nipple discharge Resp: cough, shortness of breath, or wheezing CV: chest pain or dyspnea on exertion GI: as per HPI GU: dysuria, trouble voiding, or hematuria MSK: joint pain or joint stiffness Neuro: TIA or stroke symptoms Derm: pruritus and skin lesion changes Psych: anxiety and depression  PE Blood pressure 112/72, pulse (!) 107, temperature 97.9 F (36.6 C), temperature source Oral, resp. rate 18, height 5\' 7"  (1.702 m), weight 77.1 kg, SpO2 99 %. Constitutional: NAD; conversant; no deformities; wearing mask Eyes: Moist  conjunctiva; no lid lag; anicteric; PERRL Neck: Trachea midline; no thyromegaly Lungs: Normal respiratory effort; no tactile fremitus CV: RRR; no palpable thrills; no pitting edema GI: Abd soft, flat, no tenderness with even deep palpation; no palpable hepatosplenomegaly. No distention. No rebound nor guarding. MSK: Normal range of motion of extremities; no clubbing/cyanosis Psychiatric: Appropriate affect; alert and oriented x3 Lymphatic: No palpable cervical or axillary lymphadenopathy  Results for orders placed or performed during the hospital encounter of  03/06/20 (from the past 48 hour(s))  Comprehensive metabolic panel     Status: Abnormal   Collection Time: 03/06/20 12:56 PM  Result Value Ref Range   Sodium 137 135 - 145 mmol/L   Potassium 3.6 3.5 - 5.1 mmol/L   Chloride 105 98 - 111 mmol/L   CO2 22 22 - 32 mmol/L   Glucose, Bld 151 (H) 70 - 99 mg/dL    Comment: Glucose reference range applies only to samples taken after fasting for at least 8 hours.   BUN 20 6 - 20 mg/dL   Creatinine, Ser 4.19 0.44 - 1.00 mg/dL   Calcium 8.7 (L) 8.9 - 10.3 mg/dL   Total Protein 6.3 (L) 6.5 - 8.1 g/dL   Albumin 3.8 3.5 - 5.0 g/dL   AST 60 (H) 15 - 41 U/L   ALT 44 0 - 44 U/L   Alkaline Phosphatase 108 38 - 126 U/L   Total Bilirubin 0.7 0.3 - 1.2 mg/dL   GFR, Estimated >37 >90 mL/min    Comment: (NOTE) Calculated using the CKD-EPI Creatinine Equation (2021)    Anion gap 10 5 - 15    Comment: Performed at [facility], [address], [place], [place] [address]  Lipase, blood     Status: None   Collection Time: 03/06/20 12:56 PM  Result Value Ref Range   Lipase 29 11 - 51 U/L    Comment: Performed at [facility], [address] [place], [place] [address]  Lactic acid, plasma     Status: None   Collection Time: 03/06/20 12:58 PM  Result Value Ref Range   Lactic Acid, Venous 1.0 0.5 - 1.9 mmol/L    Comment: Performed at [facility] [address], [place], [place] [address]  CBC with Differential/Platelet     Status: None   Collection Time: 03/06/20  1:00 PM  Result Value Ref Range   WBC 8.1 4.0 - 10.5 K/uL   RBC 4.17 3.87 - 5.11 MIL/uL   Hemoglobin 12.7 12.0 - 15.0 g/dL   HCT 83.4 19.6 - 22.2 %   MCV 92.1 80.0 - 100.0 fL   MCH 30.5 26.0 - 34.0 pg   MCHC 33.1 30.0 - 36.0 g/dL   RDW 97.9 89.2 - 11.9 %   Platelets 240 150 - 400 K/uL   nRBC 0.0 0.0 - 0.2 %   Neutrophils Relative % 74 %   Neutro Abs 6.0 1.7 - 7.7 K/uL   Lymphocytes Relative 14 %   Lymphs Abs 1.1 0.7 - 4.0 K/uL   Monocytes Relative 10 %   Monocytes Absolute 0.8 0.1 - 1.0 K/uL   Eosinophils Relative 1 %   Eosinophils Absolute 0.1 0.0 - 0.5 K/uL   Basophils Relative 1 %   Basophils Absolute 0.0 0.0 - 0.1 K/uL   Immature Granulocytes 0 %   Abs Immature Granulocytes 0.01 0.00 - 0.07 K/uL    Comment: Performed at [facility], [address], [place], [place] [address]    No results found.   A/P: BRIGHTYN MOZER is an 60 y.o. female with hx of RYGB here with what was initially read as possible SBO on CT at [facility]  -WBC normal, HCO3 normal, exam is completely benign -Given her dramatic clinical improvement, we reviewed options going forward. She is asking if she can go home. I think given the findings on her CT, her surgical anatomy, and [place] wanting to be  sure nothing is being missed, repeating her CT A/P with contrast will be helpful. -We have spent time reviewing her case with Dr. Andrey Campanile as well  -Will await results of CT before deciding next steps  Stephanie Coup. Cliffton Asters, M.D. [facility] Use [email or web address] to contact on call provider

## 2020-03-06 NOTE — ED Notes (Signed)
Per RN from [facility], patient had a "special" abdominal surgery 2 years ago-has had complications on and off since-states surgeon, Dr Freida Busman is aware of current symptoms and wants patient transported for possible surgery R/T possible ischemic bowel

## 2020-03-06 NOTE — ED Provider Notes (Addendum)
[facility]   CSN: [id number] Arrival date & time: 03/06/20  1140     History Chief Complaint  Patient presents with  . Abdominal Pain  . Nausea    Ashlee Thomas is a 60 y.o. female.  60 year old female presents from outside hospital due to high-grade small bowel obstruction.  Patient denies any abdominal pain, emesis, fever.  Patient's surgeon is here in town which is why she was transferred here.  She has no other complaints of        Past Medical History:  Diagnosis Date  . Bowel obstruction (HCC)   . Depression   . Diabetes mellitus   . Hyperlipidemia   . Hypertension     Patient Active Problem List   Diagnosis Date Noted  . Hypertension   . Hyperlipidemia   . Diabetes mellitus without complication (HCC)   . SBO (small bowel obstruction) (HCC)   . Sepsis (HCC)   . Depression     Past Surgical History:  Procedure Laterality Date  . gastric by pass    . HERNIA REPAIR    . LAPAROSCOPY N/A 08/17/2018   Procedure: LAPAROSCOPY DIAGNOSTIC;  Surgeon: Gaynelle Adu, MD;  Location: [facility];  Service: General;  Laterality: N/A;  . OVARIAN CYST REMOVAL    . tonsillectomhy       OB History   No obstetric history on file.     Family History  Problem Relation Age of Onset  . Diabetes Mother     Social History   Tobacco Use  . Smoking status: Former Games developer  . Smokeless tobacco: Never Used  Vaping Use  . Vaping Use: Never used  Substance Use Topics  . Alcohol use: Never  . Drug use: Never    Home Medications Prior to Admission medications   Medication Sig Start Date End Date Taking? Authorizing Provider  atorvastatin (LIPITOR) 10 MG tablet Take 10 mg by mouth every evening.    [provider]  Exenatide ER (BYDUREON BCISE) 2 MG/0.85ML AUIJ Inject 2 mg into the skin once a week.    [provider]  lisinopril-hydrochlorothiazide (ZESTORETIC) 20-12.5 MG tablet Take 1 tablet by mouth  daily.    [provider]  metFORMIN (GLUCOPHAGE-XR) 500 MG 24 hr tablet Take 500 mg by mouth daily with breakfast.    [provider]  Multiple Vitamins-Iron (ONE-TABLET-DAILY/IRON PO) Take 1 tablet by mouth daily.    [provider]  Multiple Vitamins-Minerals (WOMENS MULTI VITAMIN & MINERAL) TABS Take 1 tablet by mouth daily.    [provider]  traMADol (ULTRAM) 50 MG tablet Take 1 tablet (50 mg total) by mouth every 6 (six) hours as needed for severe pain. 08/17/18   Gaynelle Adu, MD  venlafaxine [facility]) 100 MG tablet Take 150 mg by mouth daily.    [provider]  vitamin E 400 UNIT capsule Take 400 Units by mouth daily.    [provider]    Allergies    Patient has no known allergies.  Review of Systems   Review of Systems  All other systems reviewed and are negative.   Physical Exam Updated Vital Signs BP 112/72 (BP Location: Right Arm)   Pulse (!) 107   Temp 97.9 F (36.6 C) (Oral)   Resp 18   Ht 1.702 m (5\' 7" )   Wt 77.1 kg   SpO2 99%   BMI 26.63 kg/m   Physical Exam Vitals and nursing note reviewed.  Constitutional:      General: She is not in acute distress.    Appearance: Normal appearance. She is well-developed and well-nourished. She is not toxic-appearing.  HENT:     Head: Normocephalic and atraumatic.  Eyes:     General: Lids are normal.     Extraocular Movements: EOM normal.     Conjunctiva/sclera: Conjunctivae normal.     Pupils: Pupils are equal, round, and reactive to light.  Neck:     Thyroid: No thyroid mass.     Trachea: No tracheal deviation.  Cardiovascular:     Rate and Rhythm: Normal rate and regular rhythm.     Heart sounds: Normal heart sounds. No murmur heard. No gallop.   Pulmonary:     Effort: Pulmonary effort is normal. No respiratory distress.     Breath sounds: Normal breath sounds. No stridor. No decreased breath sounds, wheezing, rhonchi or rales.  Abdominal:     General:  Bowel sounds are normal. There is no distension.     Palpations: Abdomen is soft.     Tenderness: There is no abdominal tenderness. There is no CVA tenderness or rebound.  Musculoskeletal:        General: No tenderness or edema. Normal range of motion.     Cervical back: Normal range of motion and neck supple.  Skin:    General: Skin is warm and dry.     Findings: No abrasion or rash.  Neurological:     Mental Status: She is alert and oriented to person, place, and time.     GCS: GCS eye subscore is 4. GCS verbal subscore is 5. GCS motor subscore is 6.     Cranial Nerves: No cranial nerve deficit.     Sensory: No sensory deficit.     Deep Tendon Reflexes: Strength normal.  Psychiatric:        Mood and Affect: Mood and affect normal.        Speech: Speech normal.        Behavior: Behavior normal.     ED Results / Procedures / Treatments   Labs (all labs ordered are listed, but only abnormal results are displayed) Labs Reviewed  RESP PANEL BY RT-PCR (FLU A&B, COVID) ARPGX2  CBC WITH DIFFERENTIAL/PLATELET  COMPREHENSIVE METABOLIC PANEL  LACTIC ACID, PLASMA  LIPASE, BLOOD    EKG None  Radiology No results found.  Procedures Procedures   Medications Ordered in ED Medications  0.9 %  sodium chloride infusion (has no administration in time range)    ED Course  I have reviewed the triage vital signs and the nursing notes.  Pertinent labs & imaging results that were available during my care of the patient were reviewed by me and considered in my medical decision making (see chart for details).    MDM Rules/Calculators/A&P                          Labs ordered and are pending at this time.  She has no complaints of pain currently.  Dr. Cliffton Asters from general surgery is here to see the patient  12:49 PM Plan at this time is to repeat patient's abdominal CT.  General surgery to follow up on results Final Clinical Impression(s) / ED Diagnoses Final diagnoses:  None     Rx / DC Orders ED Discharge Orders    None       Lorre Nick, MD 03/06/20 1216    Lorre Nick, MD  03/06/20 1249  

## 2020-03-06 NOTE — Discharge Instructions (Addendum)
You are seen in the ER by our surgical service. They think that you have had partial obstruction -however it looks better and is resolving.  They recommend that you take clear liquid diet for the next 24 hours. Advance to soft diet for 24-48 hours thereafter. Advance to solids thereafter if doing well.  Ensure you are chewing food really well before swallowing it.  Return to the ER if the symptoms are getting worse.

## 2020-03-06 NOTE — ED Triage Notes (Signed)
Per EMS pt from [facility] due to abd pain n/v. HX gastric bypass, hernia repair, diabetes, appendectomy. Pt alert and oriented X4.   127/65 H 106 100 RA

## 2020-03-06 NOTE — ED Provider Notes (Signed)
  Physical Exam  BP 107/79   Pulse 97   Temp 97.9 F (36.6 C) (Oral)   Resp 18   Ht 5\' 7"  (1.702 m)   Wt 77.1 kg   SpO2 100%   BMI 26.63 kg/m   Physical Exam  ED Course/Procedures     Procedures  MDM   Assuming care of patient from Dr.   Patient in the ED for abd discomfort. Labs are reassuring.  Concerning findings are as following : none Important pending results are Surgery consult.  According to Dr. Freida Busman, plan is to f/u on Surgery recs, he anticipated d.c   Patient had no complains, no concerns from the nursing side. Will continue to monitor.  5:06 PM Dr. Freida Busman has cleared pt for d/c.        Cliffton Asters, MD 03/06/20 [phone]

## 2020-03-06 NOTE — Progress Notes (Signed)
I have reviewed the CT scan alongside my bariatric partner Dr. Andrey Campanile. Findings consistent with her recently resolving a partial type obstruction - dramatic improvement in the CT relative to 1d ago at [facility].  Given her complete clinical resolution, we feel if she passes a PO challenge here, it would be safe for her to go home. Would plan for full liquids for the next 24-48 hrs and then a diet as tolerated ensuring she is chewing her food well. She is planning on going home with her daughter whom lives locally in [place] to ensure no early recurrence of her symptoms. ER warnings have been additionally reviewed.  Marin Olp, MD [facility] Use [email or web address] to contact on call provider

## 2021-04-18 IMAGING — CT CT ABD-PELV W/ CM
2 of 5 series · 12 of 46 positions shown, 14 images · IV contrast (OMNIPAQUE 300)
Comparison: Outside imaging is unavailable at the time of
interpretation. Comparison CT 08/03/2018, abdominal radiograph
08/05/2018.
COMPARISON: Outside imaging is unavailable at the time of
interpretation. Comparison CT 08/03/2018, abdominal radiograph
08/05/2018.

Addendum:
CLINICAL DATA: Bowel obstruction, history of Roux-en-Y gastric
bypass, outside hospital CT demonstrating small bowel volvulus 12
hours prior, symptoms have since resolved.

EXAM:
CT ABDOMEN AND PELVIS WITH CONTRAST
TECHNIQUE: Multidetector CT imaging of the abdomen and pelvis was performed
using the standard protocol following bolus administration of
intravenous contrast.
CONTRAST:  100mL OMNIPAQUE IOHEXOL 300 MG/ML  SOLN

[Series 2: axial st · axial · 0.86mm/px · z∈[+900,+1304]mm · 9 of 95 slices shown, 11 images]
[im 7/95  soft-tissue]
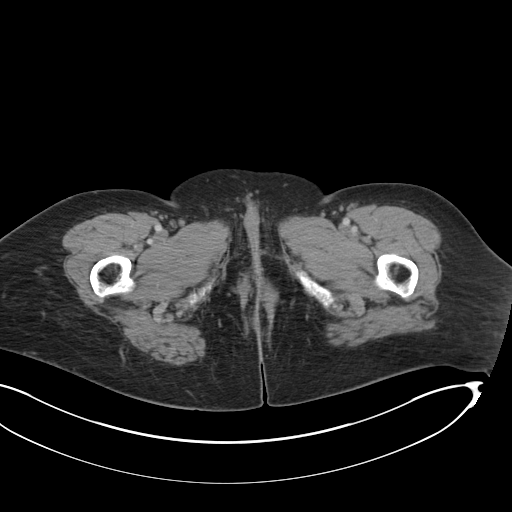
[im 7/95  bone]
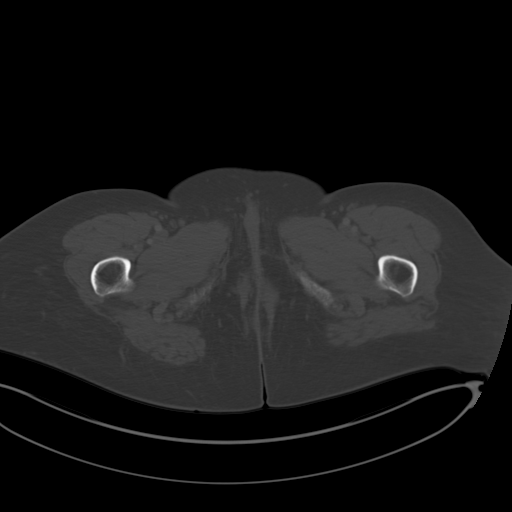
[im 19/95  soft-tissue]
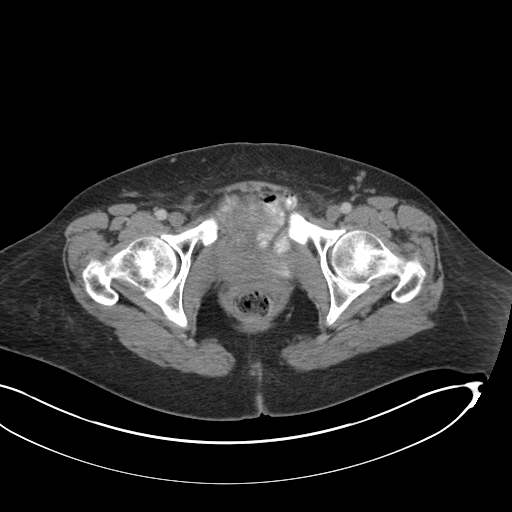
[im 26/95  soft-tissue]
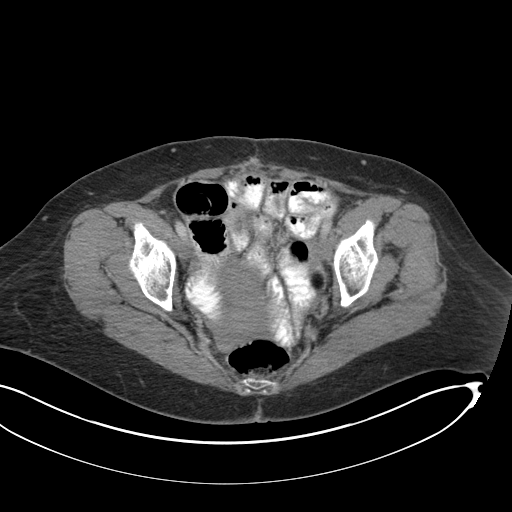
[im 38/95  soft-tissue]
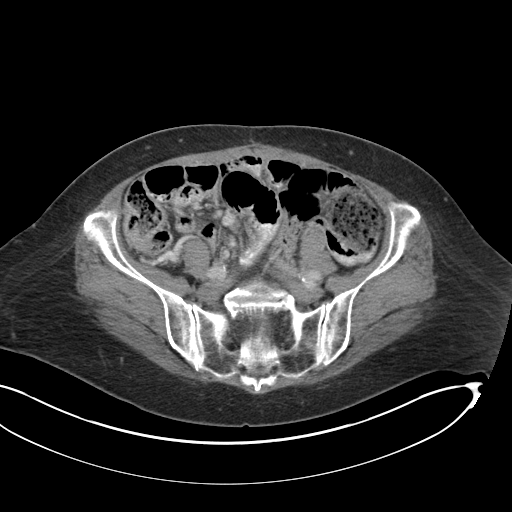
[im 51/95  soft-tissue]
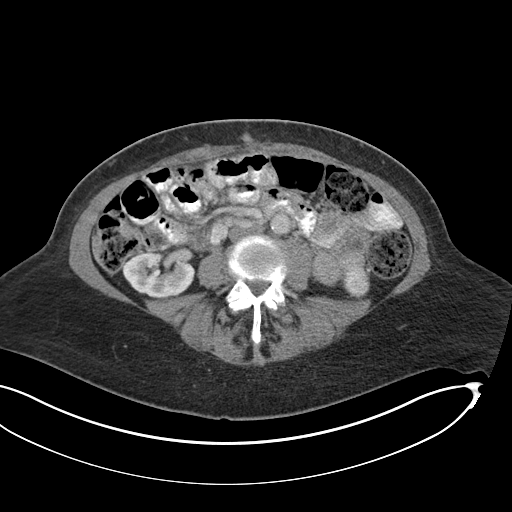
[im 57/95  soft-tissue]
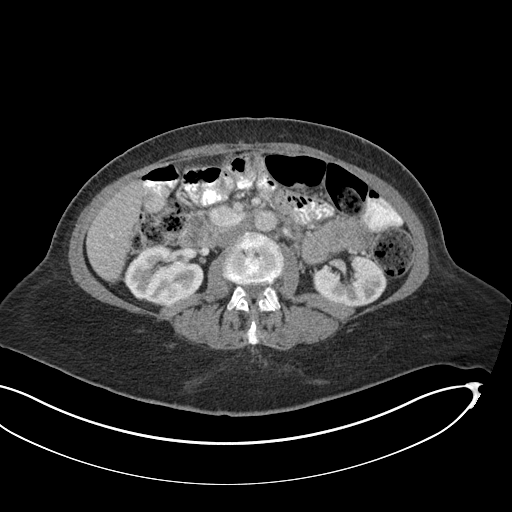
[im 69/95  soft-tissue]
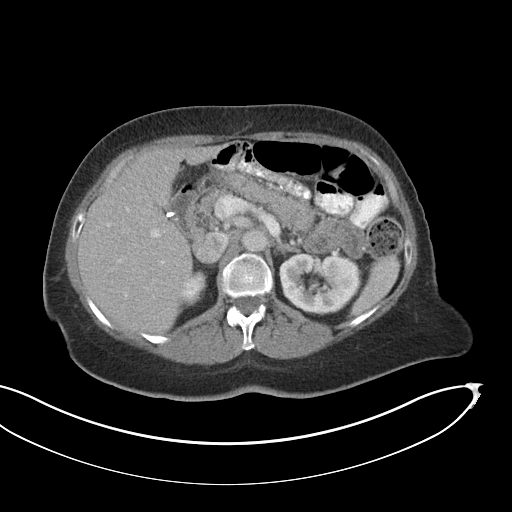
[im 76/95  soft-tissue]
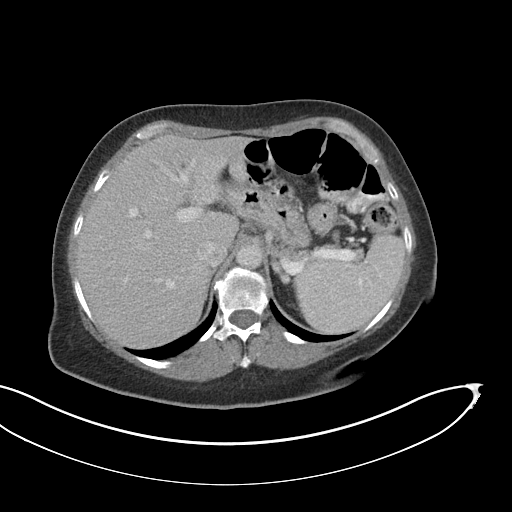
[im 88/95  soft-tissue]
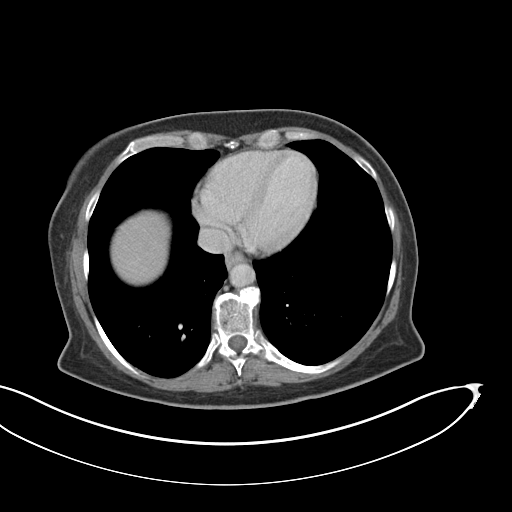
[im 88/95  bone]
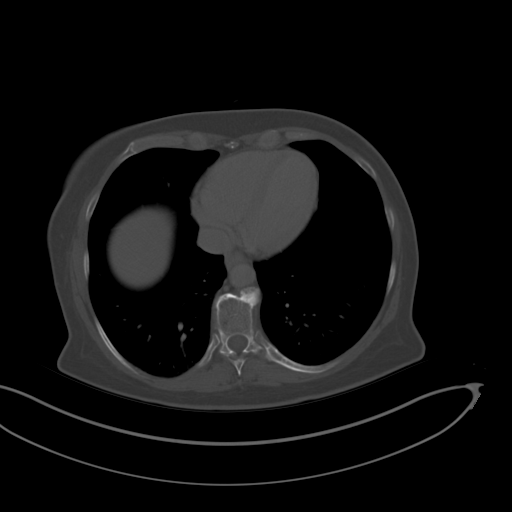

[Series 4: coronal st · coronal · 0.91mm/px · 3 of 151 slices shown]
[im 51/151  soft-tissue]
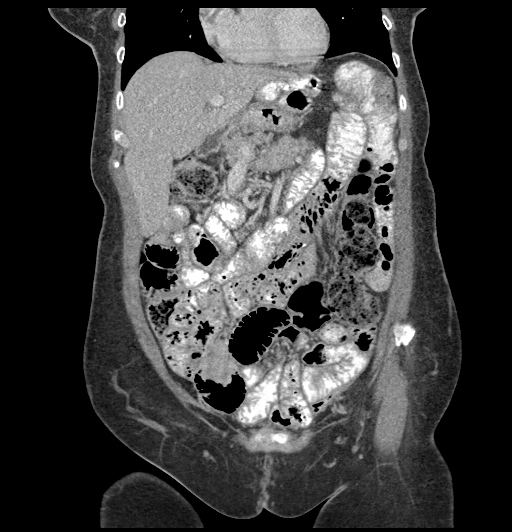
[im 67/151  soft-tissue]
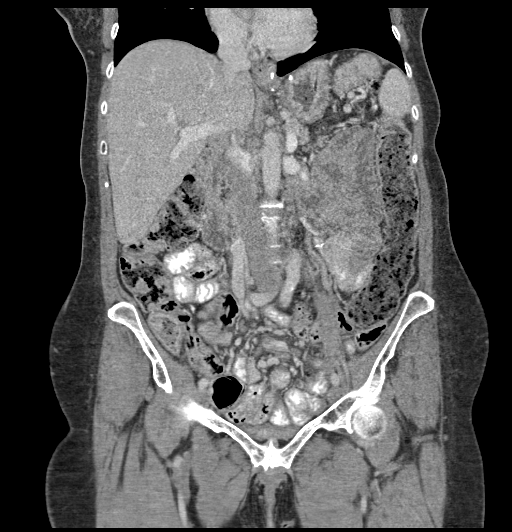
[im 84/151  soft-tissue]
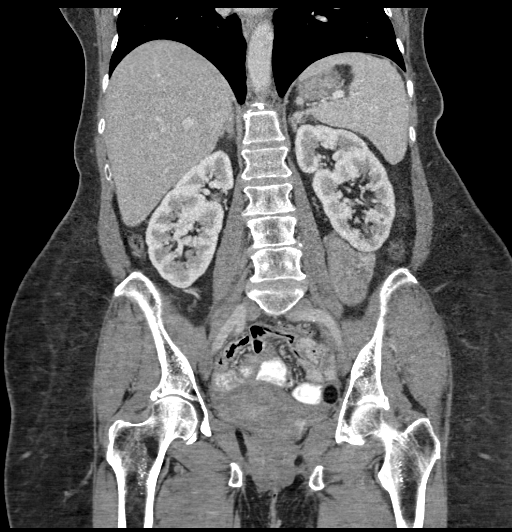

[12 of 46 positions shown; findings below may reference images not displayed]

FINDINGS: Lower chest: Atelectatic changes in the otherwise clear lung bases.
Insert

Hepatobiliary: Benign-appearing indeterminate capsular calcification
towards the right hepatic dome. No worrisome focal liver lesions.
Smooth liver surface contour. Normal hepatic attenuation. Prior
cholecystectomy. No significant biliary ductal dilatation or visible
intraductal gallstones.

Pancreas: No pancreatic ductal dilatation or surrounding
inflammatory changes.

Spleen: Normal in size. No concerning splenic lesions.

Adrenals/Urinary Tract: Normal adrenal glands. Kidneys are normally
located with symmetric enhancement and excretion. No suspicious
renal lesion, urolithiasis or hydronephrosis. Some high attenuation
contrast media is seen layering within the urinary bladder, may
reflect excreted contrast media if outside CT was a contrast
enhanced exam.

Stomach/Bowel: Distal esophagus unremarkable. Postsurgical changes
of the stomach compatible with antecolic Roux-en-Y gastric bypass.
No significant dilatation of the biliopancreatic or alimentary limbs
is seen at this time. High attenuation enteric contrast media
traverses part way through the small bowel with several contiguous
loops of borderline distended small bowel present in the low abdomen
adjacent some focal twisting of the central mesentery with nodal
congestion and edematous changes. No long segment obstruction,
discernible transition points or upstream dilatation. Moderate
colonic stool burden.

Vascular/Lymphatic: Atherosclerotic calcifications within the
abdominal aorta and branch vessels. No aneurysm or ectasia. No
enlarged abdominopelvic lymph nodes. Twisting of the central
mesenteric vessels with some mild lymph node engorgement about this
twisted mesentery (2/61).

Reproductive: Normal appearance of the uterus and adnexal
structures.

Other: Some mild edematous changes and trace free fluid seen in the
central mesentery around a region of mesenteric twisting, as
described above. No free air. No pneumatosis or portal venous gas.

Musculoskeletal: Grade 2 anterolisthesis L5 on S1 with bilateral L5
pars defects, overall similar to prior. Multilevel discogenic and
facet degenerative changes present throughout the lumbar spine, hips
and pelvis. No acute osseous abnormality or suspicious osseous
lesion.
IMPRESSION: 1. Several loops of borderline distended small bowel present in the
low abdomen adjacent focal twisting of the central mesentery with
nodal congestion and edematous changes. No long segment of strain
obstruction, discernible or paired transition points. Findings could
reflect some low-grade residual obstruction as a result of small
bowel volvulus. If outside images were obtained, addendum could be
submitted to provide more direct comparison.
2. Postsurgical changes of antecolic Roux-en-Y gastric bypass.
3. Grade 2 anterolisthesis L5 on S1 with bilateral L5 pars defects,
overall similar to prior.
4. Moderate colonic stool burden. Correlate for symptoms of
constipation.
5. Aortic Atherosclerosis ([id number]-S37.7).

ADDENDUM:
These results were called by telephone at the time of interpretation
on 03/06/2020 at [DATE] to provider Dr. Sica, who verbally
acknowledged these results.

*** End of Addendum ***
FINDINGS: Lower chest: Atelectatic changes in the otherwise clear lung bases.
Insert

Hepatobiliary: Benign-appearing indeterminate capsular calcification
towards the right hepatic dome. No worrisome focal liver lesions.
Smooth liver surface contour. Normal hepatic attenuation. Prior
cholecystectomy. No significant biliary ductal dilatation or visible
intraductal gallstones.

Pancreas: No pancreatic ductal dilatation or surrounding
inflammatory changes.

Spleen: Normal in size. No concerning splenic lesions.

Adrenals/Urinary Tract: Normal adrenal glands. Kidneys are normally
located with symmetric enhancement and excretion. No suspicious
renal lesion, urolithiasis or hydronephrosis. Some high attenuation
contrast media is seen layering within the urinary bladder, may
reflect excreted contrast media if outside CT was a contrast
enhanced exam.

Stomach/Bowel: Distal esophagus unremarkable. Postsurgical changes
of the stomach compatible with antecolic Roux-en-Y gastric bypass.
No significant dilatation of the biliopancreatic or alimentary limbs
is seen at this time. High attenuation enteric contrast media
traverses part way through the small bowel with several contiguous
loops of borderline distended small bowel present in the low abdomen
adjacent some focal twisting of the central mesentery with nodal
congestion and edematous changes. No long segment obstruction,
discernible transition points or upstream dilatation. Moderate
colonic stool burden.

Vascular/Lymphatic: Atherosclerotic calcifications within the
abdominal aorta and branch vessels. No aneurysm or ectasia. No
enlarged abdominopelvic lymph nodes. Twisting of the central
mesenteric vessels with some mild lymph node engorgement about this
twisted mesentery (2/61).

Reproductive: Normal appearance of the uterus and adnexal
structures.

Other: Some mild edematous changes and trace free fluid seen in the
central mesentery around a region of mesenteric twisting, as
described above. No free air. No pneumatosis or portal venous gas.

Musculoskeletal: Grade 2 anterolisthesis L5 on S1 with bilateral L5
pars defects, overall similar to prior. Multilevel discogenic and
facet degenerative changes present throughout the lumbar spine, hips
and pelvis. No acute osseous abnormality or suspicious osseous
lesion.
IMPRESSION: 1. Several loops of borderline distended small bowel present in the
low abdomen adjacent focal twisting of the central mesentery with
nodal congestion and edematous changes. No long segment of strain
obstruction, discernible or paired transition points. Findings could
reflect some low-grade residual obstruction as a result of small
bowel volvulus. If outside images were obtained, addendum could be
submitted to provide more direct comparison.
2. Postsurgical changes of antecolic Roux-en-Y gastric bypass.
3. Grade 2 anterolisthesis L5 on S1 with bilateral L5 pars defects,
overall similar to prior.
4. Moderate colonic stool burden. Correlate for symptoms of
constipation.
5. Aortic Atherosclerosis (6X5GC-S37.7).
